# Patient Record
Sex: Male | Born: 1945
Health system: Southern US, Community
[De-identification: ages and names within clinical notes are randomized; demographics above are authoritative.]

## PROBLEM LIST (undated history)

## (undated) DIAGNOSIS — I10 Essential (primary) hypertension: Secondary | ICD-10-CM

## (undated) DIAGNOSIS — Z8711 Personal history of peptic ulcer disease: Secondary | ICD-10-CM

## (undated) DIAGNOSIS — K219 Gastro-esophageal reflux disease without esophagitis: Secondary | ICD-10-CM

## (undated) DIAGNOSIS — M1990 Unspecified osteoarthritis, unspecified site: Secondary | ICD-10-CM

## (undated) DIAGNOSIS — Z8719 Personal history of other diseases of the digestive system: Secondary | ICD-10-CM

## (undated) HISTORY — PX: OTHER SURGICAL HISTORY: SHX169

---

## 2001-09-01 ENCOUNTER — Emergency Department (HOSPITAL_COMMUNITY): Admission: EM | Admit: 2001-09-01 | Discharge: 2001-09-01 | Payer: Self-pay | Admitting: Emergency Medicine

## 2001-09-08 ENCOUNTER — Ambulatory Visit (HOSPITAL_COMMUNITY): Admission: RE | Admit: 2001-09-08 | Discharge: 2001-09-08 | Payer: Self-pay | Admitting: Emergency Medicine

## 2001-09-08 ENCOUNTER — Encounter: Payer: Self-pay | Admitting: Emergency Medicine

## 2001-09-22 ENCOUNTER — Ambulatory Visit (HOSPITAL_BASED_OUTPATIENT_CLINIC_OR_DEPARTMENT_OTHER): Admission: RE | Admit: 2001-09-22 | Discharge: 2001-09-23 | Payer: Self-pay | Admitting: Orthopedic Surgery

## 2008-10-24 ENCOUNTER — Ambulatory Visit: Payer: Self-pay | Admitting: Orthopedic Surgery

## 2008-10-24 DIAGNOSIS — M25519 Pain in unspecified shoulder: Secondary | ICD-10-CM

## 2011-04-27 ENCOUNTER — Emergency Department (HOSPITAL_COMMUNITY): Payer: Medicare PPO

## 2011-04-27 ENCOUNTER — Inpatient Hospital Stay (HOSPITAL_COMMUNITY)
Admission: EM | Admit: 2011-04-27 | Discharge: 2011-05-04 | DRG: 177 | Disposition: A | Discharge status: Home / Self Care | Payer: Medicare PPO | Attending: Internal Medicine | Admitting: Internal Medicine

## 2011-04-27 ENCOUNTER — Encounter (HOSPITAL_COMMUNITY): Payer: Self-pay | Admitting: Radiology

## 2011-04-27 DIAGNOSIS — E876 Hypokalemia: Secondary | ICD-10-CM | POA: Diagnosis not present

## 2011-04-27 DIAGNOSIS — D649 Anemia, unspecified: Secondary | ICD-10-CM | POA: Diagnosis not present

## 2011-04-27 DIAGNOSIS — N179 Acute kidney failure, unspecified: Secondary | ICD-10-CM | POA: Diagnosis present

## 2011-04-27 DIAGNOSIS — A419 Sepsis, unspecified organism: Secondary | ICD-10-CM | POA: Diagnosis present

## 2011-04-27 DIAGNOSIS — E872 Acidosis, unspecified: Secondary | ICD-10-CM | POA: Diagnosis present

## 2011-04-27 DIAGNOSIS — A481 Legionnaires' disease: Principal | ICD-10-CM | POA: Diagnosis present

## 2011-04-27 DIAGNOSIS — R7301 Impaired fasting glucose: Secondary | ICD-10-CM | POA: Diagnosis present

## 2011-04-27 DIAGNOSIS — B370 Candidal stomatitis: Secondary | ICD-10-CM | POA: Diagnosis present

## 2011-04-27 DIAGNOSIS — J9600 Acute respiratory failure, unspecified whether with hypoxia or hypercapnia: Secondary | ICD-10-CM | POA: Diagnosis present

## 2011-04-27 HISTORY — DX: Essential (primary) hypertension: I10

## 2011-04-27 LAB — DIFFERENTIAL
Basophils Absolute: 0 10*3/uL (ref 0.0–0.1)
Basophils Relative: 0 % (ref 0–1)
Eosinophils Absolute: 0 10*3/uL (ref 0.0–0.7)
Eosinophils Relative: 0 % (ref 0–5)
Lymphocytes Relative: 3 % — ABNORMAL LOW (ref 12–46)
Lymphs Abs: 0.5 10*3/uL — ABNORMAL LOW (ref 0.7–4.0)
Monocytes Absolute: 1.3 10*3/uL — ABNORMAL HIGH (ref 0.1–1.0)
Monocytes Relative: 7 % (ref 3–12)
Neutro Abs: 18.6 10*3/uL — ABNORMAL HIGH (ref 1.7–7.7)
Neutrophils Relative %: 91 % — ABNORMAL HIGH (ref 43–77)

## 2011-04-27 LAB — LACTIC ACID, PLASMA: Lactic Acid, Venous: 2.5 mmol/L — ABNORMAL HIGH (ref 0.5–2.2)

## 2011-04-27 LAB — INFLUENZA PANEL BY PCR (TYPE A & B): Influenza A By PCR: NEGATIVE

## 2011-04-27 LAB — CBC
HCT: 40.2 % (ref 39.0–52.0)
Hemoglobin: 13.5 g/dL (ref 13.0–17.0)
MCH: 32.1 pg (ref 26.0–34.0)
MCHC: 33.6 g/dL (ref 30.0–36.0)
MCV: 95.7 fL (ref 78.0–100.0)
Platelets: 167 10*3/uL (ref 150–400)
RBC: 4.2 MIL/uL — ABNORMAL LOW (ref 4.22–5.81)
RDW: 13.8 % (ref 11.5–15.5)
WBC: 20.4 10*3/uL — ABNORMAL HIGH (ref 4.0–10.5)

## 2011-04-27 LAB — BASIC METABOLIC PANEL
Calcium: 8.6 mg/dL (ref 8.4–10.5)
GFR calc Af Amer: 41 mL/min — ABNORMAL LOW (ref >60)
GFR calc non Af Amer: 34 mL/min — ABNORMAL LOW (ref >60)
Glucose, Bld: 200 mg/dL — ABNORMAL HIGH (ref 70–99)
Potassium: 3.7 mEq/L (ref 3.5–5.1)
Sodium: 134 mEq/L — ABNORMAL LOW (ref 135–145)

## 2011-04-27 NOTE — H&P (Signed)
NAMEARDEN, John Clarke                ACCOUNT NO.:  [id number]  MEDICAL RECORD NO.:  [id number]           PATIENT TYPE:  E  LOCATION:  [facility]ED                          FACILITY:  [facility]  PHYSICIAN:  Wilson Singer, M.D.DATE OF BIRTH:  [birth year]  DATE OF ADMISSION:  04/27/2011 DATE OF DISCHARGE:  LH                             HISTORY & PHYSICAL   PRIMARY CARE PHYSICIAN:  Madelin Rear. Fusco, MD  CHIEF COMPLAINT:  Left chest pleuritic pain, diarrhea, nausea.  HISTORY OF PRESENT ILLNESS:  This is a 65 year old man who is generally quite healthy gives a 3-day history of left-sided pleuritic chest pain associated with nausea and diarrhea.  He also has been feeling somewhat febrile.  He presents to the emergency room here because he was not getting better and workup so far shows a chest x-ray consistent with left upper lobe pneumonia.  PAST MEDICAL HISTORY:  Significant for hypertension.  PAST SURGICAL HISTORY:  Right arm surgery for some sort of tendon rupture from lifting heavy weights in the gym.  SOCIAL HISTORY:  The patient is divorced but has a girlfriend at the present time.  He does not smoke, does not drink alcohol.  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  FAMILY HISTORY:  Noncontributory.  REVIEW OF SYSTEMS:  Apart from the symptoms mentioned above, there are no other symptoms referable to all systems reviewed.  MEDICATIONS:  Nifedipine 60 mg daily.  ALLERGIES:  No known drug allergies.  PHYSICAL EXAMINATION:  VITAL SIGNS:  Temperature 101.2, blood pressure 151/74, pulse 80, respiratory rate of 16, saturation 93% on room air. GENERAL:  He looks systemically well and he is not toxic.  He does not have increased work of breathing.  He does get pain in his left chest which is pleuritic in nature whenever I asked him to breathe in.  There is no clubbing.  There is no jaundice. CARDIOVASCULAR:  Heart sounds are present and normal and appear to be in sinus  rhythm.  Jugular venous pressure is not raised. RESPIRATORY:  Air entry is reduced in the left lung.  There is a reduced air entry in the left upper and middle zones.  There is no bronchial breathing or crackles. ABDOMEN:  Soft and nontender with no evidence of hepatosplenomegaly or any masses. NEUROLOGIC:  He is alert and oriented without any focal neurologic signs. SKIN:  There are no skin lesions or rashes.  INVESTIGATIONS:  Chest x-ray shows left upper and lingular lobe pneumonia.  Hemoglobin 13.5, white blood cell count 20.4 with 91% neutrophilia, platelets 167.  Sodium 134, potassium 3.7, bicarbonate 27, glucose elevated at 200, BUN 26, creatinine 2.01.  PROBLEM LIST: 1. Acute left upper lobe pneumonia. 2. Acute renal failure. 3. Hypertension. 4. Hyperglycemia.  PLAN: 1. Admit. 2. Intravenous antibiotics. 3. Intravenous fluids. 4. Check hemoglobin A1c to see if he is diabetic.  This may be just a     stress response or he may actually be diabetic.  Further recommendations will depend on the patient's hospital progress.    Wilson Singer, M.D.     NCG/MEDQ  D:  04/27/2011  T:  04/27/2011  Job:  [id number]  cc:   Madelin Rear. Sherwood Gambler, MD Fax: [phone]  Electronically Signed by Lilly Cove M.D. on 04/27/2011 05:05:35 PM

## 2011-04-28 ENCOUNTER — Inpatient Hospital Stay (HOSPITAL_COMMUNITY): Payer: Medicare PPO

## 2011-04-28 LAB — DIFFERENTIAL
Basophils Relative: 0 % (ref 0–1)
Eosinophils Absolute: 0 10*3/uL (ref 0.0–0.7)
Neutro Abs: 12.2 10*3/uL — ABNORMAL HIGH (ref 1.7–7.7)
Neutrophils Relative %: 88 % — ABNORMAL HIGH (ref 43–77)

## 2011-04-28 LAB — STREP PNEUMONIAE URINARY ANTIGEN: Strep Pneumo Urinary Antigen: NEGATIVE

## 2011-04-28 LAB — CBC
Platelets: 170 10*3/uL (ref 150–400)
RBC: 3.94 MIL/uL — ABNORMAL LOW (ref 4.22–5.81)
WBC: 13.8 10*3/uL — ABNORMAL HIGH (ref 4.0–10.5)

## 2011-04-28 LAB — COMPREHENSIVE METABOLIC PANEL
ALT: 57 U/L — ABNORMAL HIGH (ref 0–53)
Alkaline Phosphatase: 56 U/L (ref 39–117)
BUN: 29 mg/dL — ABNORMAL HIGH (ref 6–23)
CO2: 28 mEq/L (ref 19–32)
Calcium: 8.8 mg/dL (ref 8.4–10.5)
GFR calc non Af Amer: 33 mL/min — ABNORMAL LOW (ref >60)
Glucose, Bld: 158 mg/dL — ABNORMAL HIGH (ref 70–99)
Sodium: 136 mEq/L (ref 135–145)

## 2011-04-28 LAB — HEMOGLOBIN A1C
Hgb A1c MFr Bld: 6.4 % — ABNORMAL HIGH (ref <5.7)
Mean Plasma Glucose: 137 mg/dL — ABNORMAL HIGH (ref <117)

## 2011-04-29 LAB — CBC
MCH: 31.7 pg (ref 26.0–34.0)
Platelets: 177 10*3/uL (ref 150–400)
RBC: 3.72 MIL/uL — ABNORMAL LOW (ref 4.22–5.81)
WBC: 8.3 10*3/uL (ref 4.0–10.5)

## 2011-04-29 LAB — DIFFERENTIAL
Basophils Absolute: 0 10*3/uL (ref 0.0–0.1)
Basophils Relative: 0 % (ref 0–1)
Eosinophils Absolute: 0 10*3/uL (ref 0.0–0.7)
Neutrophils Relative %: 79 % — ABNORMAL HIGH (ref 43–77)

## 2011-04-29 LAB — BLOOD GAS, ARTERIAL
Bicarbonate: 23.3 mEq/L (ref 20.0–24.0)
Drawn by: 23534
Patient temperature: 37
pH, Arterial: 7.442 (ref 7.350–7.450)

## 2011-04-29 LAB — COMPREHENSIVE METABOLIC PANEL
AST: 86 U/L — ABNORMAL HIGH (ref 0–37)
Albumin: 2.4 g/dL — ABNORMAL LOW (ref 3.5–5.2)
Chloride: 104 mEq/L (ref 96–112)
Creatinine, Ser: 1.72 mg/dL — ABNORMAL HIGH (ref 0.4–1.5)
GFR calc Af Amer: 49 mL/min — ABNORMAL LOW (ref >60)
Potassium: 3.4 mEq/L — ABNORMAL LOW (ref 3.5–5.1)
Total Bilirubin: 0.4 mg/dL (ref 0.3–1.2)

## 2011-04-30 LAB — DIFFERENTIAL
Basophils Absolute: 0 10*3/uL (ref 0.0–0.1)
Basophils Relative: 0 % (ref 0–1)
Neutro Abs: 5.8 10*3/uL (ref 1.7–7.7)
Neutrophils Relative %: 73 % (ref 43–77)

## 2011-04-30 LAB — COMPREHENSIVE METABOLIC PANEL
ALT: 130 U/L — ABNORMAL HIGH (ref 0–53)
AST: 130 U/L — ABNORMAL HIGH (ref 0–37)
Albumin: 2.3 g/dL — ABNORMAL LOW (ref 3.5–5.2)
CO2: 25 mEq/L (ref 19–32)
Calcium: 8.9 mg/dL (ref 8.4–10.5)
Chloride: 106 mEq/L (ref 96–112)
GFR calc Af Amer: >60 mL/min (ref >60)
GFR calc non Af Amer: 51 mL/min — ABNORMAL LOW (ref >60)
Sodium: 138 mEq/L (ref 135–145)
Total Bilirubin: 0.4 mg/dL (ref 0.3–1.2)

## 2011-04-30 LAB — CBC
Hemoglobin: 11.5 g/dL — ABNORMAL LOW (ref 13.0–17.0)
RBC: 3.65 MIL/uL — ABNORMAL LOW (ref 4.22–5.81)

## 2011-05-01 LAB — COMPREHENSIVE METABOLIC PANEL
Albumin: 2.5 g/dL — ABNORMAL LOW (ref 3.5–5.2)
Alkaline Phosphatase: 90 U/L (ref 39–117)
BUN: 20 mg/dL (ref 6–23)
Calcium: 9.6 mg/dL (ref 8.4–10.5)
Creatinine, Ser: 1.32 mg/dL (ref 0.4–1.5)
Glucose, Bld: 136 mg/dL — ABNORMAL HIGH (ref 70–99)
Potassium: 3.6 mEq/L (ref 3.5–5.1)
Total Protein: 7.2 g/dL (ref 6.0–8.3)

## 2011-05-01 LAB — DIFFERENTIAL
Eosinophils Absolute: 0.2 10*3/uL (ref 0.0–0.7)
Eosinophils Relative: 2 % (ref 0–5)
Lymphs Abs: 1.3 10*3/uL (ref 0.7–4.0)
Monocytes Absolute: 1.3 10*3/uL — ABNORMAL HIGH (ref 0.1–1.0)

## 2011-05-01 LAB — ROCKY MTN SPOTTED FVR AB, IGG-BLOOD: RMSF IgG: 0.36 IV

## 2011-05-01 LAB — CBC
MCHC: 32.4 g/dL (ref 30.0–36.0)
MCV: 94.4 fL (ref 78.0–100.0)
Platelets: 288 10*3/uL (ref 150–400)
RDW: 14.3 % (ref 11.5–15.5)
WBC: 9.2 10*3/uL (ref 4.0–10.5)

## 2011-05-02 LAB — CULTURE, RESPIRATORY W GRAM STAIN

## 2011-05-02 LAB — CBC
HCT: 34.6 % — ABNORMAL LOW (ref 39.0–52.0)
MCHC: 33.2 g/dL (ref 30.0–36.0)
MCV: 93.8 fL (ref 78.0–100.0)
Platelets: 375 10*3/uL (ref 150–400)
RDW: 14.3 % (ref 11.5–15.5)
WBC: 9.1 10*3/uL (ref 4.0–10.5)

## 2011-05-02 LAB — CULTURE, BLOOD (ROUTINE X 2)
Culture: NO GROWTH
Culture: NO GROWTH
Culture: NO GROWTH

## 2011-05-02 LAB — DIFFERENTIAL
Basophils Absolute: 0.1 10*3/uL (ref 0.0–0.1)
Eosinophils Absolute: 0.2 10*3/uL (ref 0.0–0.7)
Eosinophils Relative: 3 % (ref 0–5)
Lymphocytes Relative: 12 % (ref 12–46)
Lymphs Abs: 1 10*3/uL (ref 0.7–4.0)
Monocytes Absolute: 1.5 10*3/uL — ABNORMAL HIGH (ref 0.1–1.0)

## 2011-05-02 LAB — BASIC METABOLIC PANEL
BUN: 20 mg/dL (ref 6–23)
Calcium: 9.5 mg/dL (ref 8.4–10.5)
GFR calc non Af Amer: 57 mL/min — ABNORMAL LOW (ref >60)
Glucose, Bld: 107 mg/dL — ABNORMAL HIGH (ref 70–99)
Potassium: 3.5 mEq/L (ref 3.5–5.1)

## 2011-05-03 LAB — COMPREHENSIVE METABOLIC PANEL
AST: 173 U/L — ABNORMAL HIGH (ref 0–37)
Albumin: 2.5 g/dL — ABNORMAL LOW (ref 3.5–5.2)
Alkaline Phosphatase: 109 U/L (ref 39–117)
BUN: 19 mg/dL (ref 6–23)
Chloride: 105 mEq/L (ref 96–112)
GFR calc Af Amer: >60 mL/min (ref >60)
Potassium: 3.7 mEq/L (ref 3.5–5.1)
Sodium: 140 mEq/L (ref 135–145)
Total Protein: 7 g/dL (ref 6.0–8.3)

## 2011-05-03 LAB — CBC
Platelets: 461 10*3/uL — ABNORMAL HIGH (ref 150–400)
RBC: 3.64 MIL/uL — ABNORMAL LOW (ref 4.22–5.81)
RDW: 14.5 % (ref 11.5–15.5)
WBC: 10.7 10*3/uL — ABNORMAL HIGH (ref 4.0–10.5)

## 2011-05-03 LAB — DIFFERENTIAL
Basophils Absolute: 0.1 10*3/uL (ref 0.0–0.1)
Basophils Relative: 1 % (ref 0–1)
Eosinophils Absolute: 0.3 10*3/uL (ref 0.0–0.7)
Eosinophils Relative: 3 % (ref 0–5)
Neutrophils Relative %: 71 % (ref 43–77)

## 2011-05-04 LAB — DIFFERENTIAL
Basophils Relative: 1 % (ref 0–1)
Eosinophils Relative: 3 % (ref 0–5)
Monocytes Absolute: 1.3 10*3/uL — ABNORMAL HIGH (ref 0.1–1.0)
Monocytes Relative: 11 % (ref 3–12)
Neutro Abs: 9 10*3/uL — ABNORMAL HIGH (ref 1.7–7.7)

## 2011-05-04 LAB — COMPREHENSIVE METABOLIC PANEL
BUN: 20 mg/dL (ref 6–23)
CO2: 28 mEq/L (ref 19–32)
Chloride: 104 mEq/L (ref 96–112)
Creatinine, Ser: 1.24 mg/dL (ref 0.4–1.5)
GFR calc non Af Amer: 59 mL/min — ABNORMAL LOW (ref >60)
Total Bilirubin: 0.5 mg/dL (ref 0.3–1.2)

## 2011-05-04 LAB — CBC
Hemoglobin: 11.6 g/dL — ABNORMAL LOW (ref 13.0–17.0)
MCH: 30.7 pg (ref 26.0–34.0)
MCHC: 32.3 g/dL (ref 30.0–36.0)
MCV: 95 fL (ref 78.0–100.0)
Platelets: 557 10*3/uL — ABNORMAL HIGH (ref 150–400)
RBC: 3.78 MIL/uL — ABNORMAL LOW (ref 4.22–5.81)

## 2011-05-05 NOTE — Group Therapy Note (Signed)
  John Clarke, John Clarke                ACCOUNT NO.:  [id number]  MEDICAL RECORD NO.:  [id number]           PATIENT TYPE:  I  LOCATION:  A338                          FACILITY:  [facility]  PHYSICIAN:  Wilson Singer, M.D.DATE OF BIRTH:  1946/01/10  DATE OF PROCEDURE:  04/28/2011 DATE OF DISCHARGE:                                PROGRESS NOTE   This man was admitted yesterday with a left upper lobe pneumonia. Overnight, he has had fevers consistently.  This morning, he feels little bit better without fever.  He is somewhat short of breath.  PHYSICAL EXAMINATION:  VITAL SIGNS:  Temperature 102.9 at 6 o'clock this morning, blood pressure 115/65, pulse 84, saturating 89% on 4 liters on oxygen. GENERAL:  He does looks systemically well and hemodynamically stable. He is not toxic. Cardiovascular:  Heart sounds are present and normal without murmurs. RESPIRATORY:  Lung fields now show clear bronchial breathing in the left upper, mid and mid zones.  There are no other crackles or wheezes throughout either lung. ABDOMEN:  Soft, nontender.  INVESTIGATIONS:  Hemoglobin 12.5, white blood cell count of 13.8 which is significantly decreased from yesterday of 20.4, platelets 170. Sodium 136, potassium 3.5, bicarbonate 28, glucose 158, BUN 29, and creatinine 2.04 which is not significantly changed.  Influenza A and B by PCR is negative.  IMPRESSION: 1. Left upper lobe pneumonia, community acquired. 2. Acute renal failure, not improved. 3. Hypertension, currently normotensive. 4. Hyperglycemia, await hemoglobin A1c.  PLAN: 1. Continue with intravenous Zithromax and Rocephin. 2. Repeat a chest x-ray today. 3. I will increase his intravenous fluids. 4. Await hemoglobin A1c to evaluate for his hyperglycemia.    Wilson Singer, M.D.    NCG/MEDQ  D:  04/28/2011  T:  04/28/2011  Job:  [id number]  Electronically Signed by Lilly Cove M.D. on 05/05/2011 08:17:20 AM

## 2011-05-05 NOTE — Discharge Summary (Signed)
NAMERANDI, COLLEGE                ACCOUNT NO.:  [id number]  MEDICAL RECORD NO.:  [id number]           PATIENT TYPE:  I  LOCATION:  A338                          FACILITY:  [facility]  PHYSICIAN:  Hillery Aldo, M.D.   DATE OF BIRTH:  [birth year]  DATE OF ADMISSION:  04/27/2011 DATE OF DISCHARGE:  05/14/2012LH                              DISCHARGE SUMMARY   PRIMARY CARE PHYSICIAN:  Madelin Rear. Sherwood Gambler, MD  DISCHARGE DIAGNOSES: 1. Legionnaires pneumonia. 2. Sepsis secondary to Legionnaires pneumonia. 3. Hypoxic respiratory failure. 4. Hypokalemia. 5. Acute renal failure, resolved. 6. Impaired fasting glucose. 7. Transaminitis secondary to Legionnaires disease. 8. Mild normocytic anemia. 9. Hypertension. 10.Recent history of tick exposure with headache. 11.Candidiasis.  DISCHARGE MEDICATIONS: 1. Tessalon Perles 100 mg p.o. t.i.d. p.r.n. cough. 2. Mucinex LA 1200 mg p.o. b.i.d. p.r.n. thick mucus. 3. Levaquin 500 mg p.o. daily times one more week. 4. Multivitamin 1 tablet p.o. daily. 5. Nifedipine XR 60 mg p.o. daily. 6. Zantac 150 mg p.o. daily p.r.n. heartburn.  CONSULTATIONS:  None.  BRIEF ADMISSION HISTORY OF PRESENT ILLNESS:  The patient is a 65 year old male with a past medical history of hypertension who presented to the hospital with a chief complaint of left-sided pleuritic chest pain, fever, and lethargy.  Upon initial evaluation in the emergency department, the patient was found to have infiltrates on chest radiography consistent with a left upper lobe pneumonia and he subsequently was referred to the Hospitalist Service for further evaluation and treatment.  For the full details, please see the dictated report done by Dr. Karilyn Cota.  PROCEDURES AND DIAGNOSTIC STUDIES: 1. Chest x-ray on Apr 27, 2011, showed extensive infiltrative changes     within the left upper lobe with a small left pleural effusion. 2. Chest x-ray on Apr 28, 2011, showed increasing diffuse left  lung     infiltrate consistent with pneumonia and bibasilar effusions.  DISCHARGE LABORATORY VALUES:  Sodium was 140, potassium 3.7, chloride 105, bicarb 29, BUN 19, creatinine 1.23, glucose 109, calcium 9.7, total bilirubin 0.5, alkaline phosphatase 109, AST 173, ALT 265, total protein 7, albumin 2.5.  White blood cell count was 10.7, hemoglobin 11.3, hematocrit 34.6, platelets 461.  Legionella urinary antigen was positive.  Blood cultures have not shown any growth.  [facility] spotted fever, IgG was 0.36 (negative).  Hemoglobin A1c was 6.4. Influenza was negative.  Lactic acid was 2.5.  HOSPITAL COURSE BY PROBLEM: 1. Legionnaires disease:  The patient was admitted and put on empiric     Rocephin and azithromycin, was presumed to be a community-acquired     pneumonia.  Sputum cultures, blood cultures, and strep pneumonia     antigen, as well as urinary Legionella antigen studies were     obtained and ultimately showed that the patient had Legionella     pneumonia.  The patient was subsequently put on Levaquin therapy     and the Rocephin and azithromycin were discontinued.  Over the     course of the patient's hospital stay, he was initially septic and     hypoxic and these issues have resolved.  He has defervesced and is     beginning to feel better.  He will be discharged on additional 7     days of treatment with Levaquin for a total treatment duration of     14 days. 2. Sepsis secondary to Legionnaires disease:  The patient did meet the     definition of sepsis and was quite ill on initial presentation.  He     was in acute renal failure with a lactic acidosis.  He was hypoxic.     He was febrile with an initial white blood cell count of 20.4.     With appropriate treatment, the patient's sepsis syndrome has     resolved. 3. Hypoxic respiratory failure:  The patient had hypoxic respiratory     failure and increased oxygen requirement, needing a 100% non-     rebreather mask  for several days.  As his clinical condition     improved, his hypoxia improved and we were ultimately able to wean     oxygen to off.  He is currently maintaining his oxygen saturations     off of oxygen. 4. Hypokalemia:  The patient's potassium was monitored closely and     repleted. 5. Acute renal failure:  The patient's initial creatinine was 2.01.     He is put on IV fluid hydration and his underlying sepsis was     treated.  His discharge creatinine is 1.23. 6. Impaired fasting glucose:  The patient does have impaired fasting     glucose and hemoglobin A1c was checked which was mildly elevated at     6.4.  This corresponds to a mean plasma glucose of 137.  The     patient was counseled extensively with regard to appropriate     dietary changes to avoid progression to overt diabetes. 7. Transaminitis:  The patient's transaminitis is secondary to     Legionnaires disease which is a prominent feature of this disorder.     We have monitored his liver function studies closely and they are     beginning to trend downward.  He should follow up with his primary     care physician later in a week to have these retested. 8. Mild normocytic anemia:  The patient's hemoglobin and hematocrit     have remained stable throughout the course of his hospital stay.     No further diagnostic evaluation was undertaken and we will leave     this to the discretion of his primary care physician.  If he has     not had appropriate outpatient screening colonoscopy, I would     recommend this. 9. Hypertension:  The patient's blood pressure is well controlled on     his home regimen of nifedipine. 10.Tick exposure with headache:  The patient did report a recent tick     exposure and headache while in the hospital.  The patient had no     rashes and his headache could adequately be explained by     Legionnaire's disease.  Nevertheless, [facility] spotted fever     titers were sent off and IgM has come  back as normal.  He was put     on empiric doxycycline for several days while in the hospital. 11.Candidiasis:  The patient's sputum cultures grew yeast which is     likely due to over colonization from recent heavy antibiotic     exposure.  The patient has completed 3 days of therapy  with     Diflucan.  DISPOSITION:  The patient is medically stable and will be discharged home.  DISCHARGE INSTRUCTIONS:  Increase activity slowly.  Follow up with your primary care physician on May 08, 2011, at scheduled appointment time.  DISCHARGE DIET:  Low carbohydrate/low concentrated sweets.  CONDITION ON DISCHARGE:  Improved.  Time spent coordinating care for discharge and discharge instructions equals approximately 40 minutes.     Hillery Aldo, M.D.     CR/MEDQ  D:  05/04/2011  T:  05/04/2011  Job:  [id number]  cc:   Madelin Rear. Sherwood Gambler, MD Fax: [phone]  Electronically Signed by Hillery Aldo M.D. on 05/05/2011 04:40:39 PM

## 2011-05-08 LAB — LEGIONELLA ANTIGEN, URINE: Legionella Antigen, Urine: POSITIVE

## 2011-05-08 NOTE — Op Note (Signed)
. [facility]  Patient:    John Clarke, John Clarke Visit Number: [id number] MRN: [id number]          Service Type: DSU Location: [facility] Attending Physician:  Colbert Ewing Dictated by:   Loreta Ave, M.D. Proc. Date: 09/22/01 Admit Date:  09/22/2001                             Operative Report  PREOPERATIVE DIAGNOSIS:  Avulsion biceps tendon off radial tuberosity, right elbow.  POSTOPERATIVE DIAGNOSIS:  Avulsion biceps tendon off radial tuberosity, right elbow.  OPERATION PERFORMED:  Repair of biceps tendon to radial tuberosity utilizing fiberwire implanting in a drill hole back in the radius.  Two incision technique.  SURGEON:  Loreta Ave, M.D.  ASSISTANT:  Arlys John D. Petrarca, P.A.-C.  ANESTHESIA:  General.  ESTIMATED BLOOD LOSS:  Minimal.  TOURNIQUET TIME:  One hour and 15 minutes.  SPECIMENS:  None.  CULTURES:  None.  COMPLICATIONS:  None.  DRESSING:  Soft compressive with long arm splint.  DESCRIPTION OF PROCEDURE:  The patient was brought to the operating room and placed on the operating table in supine position.  After adequate anesthesia had been obtained, right elbow examined.  Soft 10 to 15 degree flexion contracture manipulated to fairly full extension.  Full flexion, pronation, supination and stable elbow.  Tourniquet applied.  Prepped and draped in the usual sterile fashion.  Small transverse incision anterior aspect of the elbow after the tourniquet had been inflated and the arm exsanguinated with Esmarch. Tourniquet inflated to 250 mmHg.  Through the small transverse incision, the biceps tendon which was retracted and scarred in, in the distal arm was captured, freed up, mobilized and tapered to its original size.  It was then captured with a #2 fiberwire.  A longitudinal incision was made between the r radius and ulna on the posterior aspect of the proximal forearm to expose the radial tuberosity.  Muscle  group taken down off the ulna exposing the interval between the radius and the ulna.  Radial tuberosity identified.  A suture passer was then used to recreate the tract of the biceps tendon from the posterior incision up into the antecubital fossa.  Sutures were grasped at the antecubital fossa and brought out through the distal incision.  A large drill hole was then made in the radius at the radial tuberosity original attachment site and two smaller drill holes exiting out the opposite site of the radius. Sutures were then brought with suture passer through the larger hole and out through the smaller holes.  These were then firmly pulled down with the forearm supinated.  I was able to bring the biceps tendon down into the larger hole and then these sutures were overtied over a bony bridge on the opposite side of the radius.  Yielded a nice firm repair bringing the biceps down to original attachment site.  Still moderately tight trying to bring him into extension.  Because of the chronicity of his tear.  Reasonable motion achieved after repair.  Both wounds were copiously irrigated.  The anterior wound closed with subcutaneous and subcuticular Vicryl.  Posterior wound was closed with the fascia with Vicryl after thorough irrigation and then subcutaneous subcuticular closure with Vicryl suture.  Steri-Strips applied in both areas and injected with Marcaine.  Sterile compressive dressing and long arm splint applied.  Tourniquet inflated and removed.  Anesthesia reversed.  Brought to recovery room.  Tolerated surgery well.  No complications. Dictated by:   Loreta Ave, M.D. Attending Physician:  Colbert Ewing DD:  09/22/01 TD:  09/22/01 Job: [id number]

## 2012-10-17 ENCOUNTER — Encounter (HOSPITAL_COMMUNITY): Payer: Self-pay | Admitting: Pharmacy Technician

## 2012-10-19 NOTE — Patient Instructions (Addendum)
Your procedure is scheduled on: 10/27/2012  Report to [facility] at  0830       AM.  Call this number if you have problems the morning of surgery: [phone]   Do not eat food or drink liquids :After Midnight.      Take these medicines the morning of surgery with A SIP OF WATER:procardia   Do not wear jewelry, make-up or nail polish.  Do not wear lotions, powders, or perfumes.   Do not shave 48 hours prior to surgery.  Do not bring valuables to the hospital.  Contacts, dentures or bridgework may not be worn into surgery.  Leave suitcase in the car. After surgery it may be brought to your room.  For patients admitted to the hospital, checkout time is 11:00 AM the day of discharge.   Patients discharged the day of surgery will not be allowed to drive home.  :     Please read over the following fact sheets that you were given: Coughing and Deep Breathing, Surgical Site Infection Prevention, Anesthesia Post-op Instructions and Care and Recovery After Surgery    Cataract A cataract is a clouding of the lens of the eye. When a lens becomes cloudy, vision is reduced based on the degree and nature of the clouding. Many cataracts reduce vision to some degree. Some cataracts make people more near-sighted as they develop. Other cataracts increase glare. Cataracts that are ignored and become worse can sometimes look white. The white color can be seen through the pupil. CAUSES   Aging. However, cataracts may occur at any age, even in newborns.   Certain drugs.   Trauma to the eye.   Certain diseases such as diabetes.   Specific eye diseases such as chronic inflammation inside the eye or a sudden attack of a rare form of glaucoma.   Inherited or acquired medical problems.  SYMPTOMS   Gradual, progressive drop in vision in the affected eye.   Severe, rapid visual loss. This most often happens when trauma is the cause.  DIAGNOSIS  To detect a cataract, an eye doctor examines the lens.  Cataracts are best diagnosed with an exam of the eyes with the pupils enlarged (dilated) by drops.  TREATMENT  For an early cataract, vision may improve by using different eyeglasses or stronger lighting. If that does not help your vision, surgery is the only effective treatment. A cataract needs to be surgically removed when vision loss interferes with your everyday activities, such as driving, reading, or watching TV. A cataract may also have to be removed if it prevents examination or treatment of another eye problem. Surgery removes the cloudy lens and usually replaces it with a substitute lens (intraocular lens, IOL).  At a time when both you and your doctor agree, the cataract will be surgically removed. If you have cataracts in both eyes, only one is usually removed at a time. This allows the operated eye to heal and be out of danger from any possible problems after surgery (such as infection or poor wound healing). In rare cases, a cataract may be doing damage to your eye. In these cases, your caregiver may advise surgical removal right away. The vast majority of people who have cataract surgery have better vision afterward. HOME CARE INSTRUCTIONS  If you are not planning surgery, you may be asked to do the following:  Use different eyeglasses.   Use stronger or brighter lighting.   Ask your eye doctor about reducing your medicine dose  or changing medicines if it is thought that a medicine caused your cataract. Changing medicines does not make the cataract go away on its own.   Become familiar with your surroundings. Poor vision can lead to injury. Avoid bumping into things on the affected side. You are at a higher risk for tripping or falling.   Exercise extreme care when driving or operating machinery.   Wear sunglasses if you are sensitive to bright light or experiencing problems with glare.  SEEK IMMEDIATE MEDICAL CARE IF:   You have a worsening or sudden vision loss.   You notice  redness, swelling, or increasing pain in the eye.   You have a fever.  Document Released: 12/07/2005 Document Revised: 11/26/2011 Document Reviewed: 07/31/2011 [facility] Patient Information 2012 [place], [place].PATIENT INSTRUCTIONS POST-ANESTHESIA  IMMEDIATELY FOLLOWING SURGERY:  Do not drive or operate machinery for the first twenty four hours after surgery.  Do not make any important decisions for twenty four hours after surgery or while taking narcotic pain medications or sedatives.  If you develop intractable nausea and vomiting or a severe headache please notify your doctor immediately.  FOLLOW-UP:  Please make an appointment with your surgeon as instructed. You do not need to follow up with anesthesia unless specifically instructed to do so.  WOUND CARE INSTRUCTIONS (if applicable):  Keep a dry clean dressing on the anesthesia/puncture wound site if there is drainage.  Once the wound has quit draining you may leave it open to air.  Generally you should leave the bandage intact for twenty four hours unless there is drainage.  If the epidural site drains for more than 36-48 hours please call the anesthesia department.  QUESTIONS?:  Please feel free to call your physician or the hospital operator if you have any questions, and they will be happy to assist you.

## 2012-10-20 ENCOUNTER — Encounter (HOSPITAL_COMMUNITY): Admission: RE | Admit: 2012-10-20 | Discharge: 2012-10-20 | Payer: Medicare PPO | Source: Ambulatory Visit

## 2012-10-21 ENCOUNTER — Encounter (HOSPITAL_COMMUNITY): Payer: Self-pay

## 2012-10-21 ENCOUNTER — Encounter (HOSPITAL_COMMUNITY)
Admission: RE | Admit: 2012-10-21 | Discharge: 2012-10-21 | Disposition: A | Discharge status: Home / Self Care | Payer: Medicare PPO | Source: Ambulatory Visit | Attending: Ophthalmology | Admitting: Ophthalmology

## 2012-10-21 HISTORY — DX: Personal history of peptic ulcer disease: Z87.11

## 2012-10-21 HISTORY — DX: Personal history of other diseases of the digestive system: Z87.19

## 2012-10-21 HISTORY — DX: Unspecified osteoarthritis, unspecified site: M19.90

## 2012-10-21 HISTORY — DX: Gastro-esophageal reflux disease without esophagitis: K21.9

## 2012-10-21 LAB — BASIC METABOLIC PANEL
GFR calc Af Amer: 60 mL/min — ABNORMAL LOW (ref >90)
GFR calc non Af Amer: 52 mL/min — ABNORMAL LOW (ref >90)
Potassium: 4 mEq/L (ref 3.5–5.1)
Sodium: 139 mEq/L (ref 135–145)

## 2012-10-21 LAB — HEMOGLOBIN AND HEMATOCRIT, BLOOD
HCT: 39.6 % (ref 39.0–52.0)
Hemoglobin: 13.5 g/dL (ref 13.0–17.0)

## 2012-10-21 NOTE — Patient Instructions (Addendum)
20 KIYOTO SLOMSKI  10/21/2012   Your procedure is scheduled on:  10/27/12  Report to [facility] at 08:40 AM.  Call this number if you have problems the morning of surgery: [phone]   Remember:   Do not eat or drink:After Midnight.  Take these medicines the morning of surgery with A SIP OF WATER: Nifedipine   Do not wear jewelry, make-up or nail polish.  Do not wear lotions, powders, or perfumes. You may wear deodorant.  Do not shave 48 hours prior to surgery. Men may shave face and neck.  Do not bring valuables to the hospital.  Contacts, dentures or bridgework may not be worn into surgery.  Leave suitcase in the car. After surgery it may be brought to your room.  For patients admitted to the hospital, checkout time is 11:00 AM the day of discharge.   Patients discharged the day of surgery will not be allowed to drive home.   Special Instructions: Start using your eye drops as directed by your eye doctor.   Please read over the following fact sheets that you were given: Anesthesia Post-op Instructions    Cataract Surgery  A cataract is a clouding of the lens of the eye. When a lens becomes cloudy, vision is reduced based on the degree and nature of the clouding. Surgery may be needed to improve vision. Surgery removes the cloudy lens and usually replaces it with a substitute lens (intraocular lens, IOL). LET YOUR EYE DOCTOR KNOW ABOUT:  Allergies to food or medicine.  Medicines taken including herbs, eyedrops, over-the-counter medicines, and creams.  Use of steroids (by mouth or creams).  Previous problems with anesthetics or numbing medicine.  History of bleeding problems or blood clots.  Previous surgery.  Other health problems, including diabetes and kidney problems.  Possibility of pregnancy, if this applies. RISKS AND COMPLICATIONS  Infection.  Inflammation of the eyeball (endophthalmitis) that can spread to both eyes (sympathetic ophthalmia).  Poor wound  healing.  If an IOL is inserted, it can later fall out of proper position. This is very uncommon.  Clouding of the part of your eye that holds an IOL in place. This is called an "after-cataract." These are uncommon, but easily treated. BEFORE THE PROCEDURE  Do not eat or drink anything except small amounts of water for 8 to 12 before your surgery, or as directed by your caregiver.  Unless you are told otherwise, continue any eyedrops you have been prescribed.  Talk to your primary caregiver about all other medicines that you take (both prescription and non-prescription). In some cases, you may need to stop or change medicines near the time of your surgery. This is most important if you are taking blood-thinning medicine.Do not stop medicines unless you are told to do so.  Arrange for someone to drive you to and from the procedure.  Do not put contact lenses in either eye on the day of your surgery. PROCEDURE There is more than one method for safely removing a cataract. Your doctor can explain the differences and help determine which is best for you. Phacoemulsification surgery is the most common form of cataract surgery.  An injection is given behind the eye or eyedrops are given to make this a painless procedure.  A small cut (incision) is made on the edge of the clear, dome-shaped surface that covers the front of the eye (cornea).  A tiny probe is painlessly inserted into the eye. This device gives off ultrasound waves that soften  and break up the cloudy center of the lens. This makes it easier for the cloudy lens to be removed by suction.  An IOL may be implanted.  The normal lens of the eye is covered by a clear capsule. Part of that capsule is intentionally left in the eye to support the IOL.  Your surgeon may or may not use stitches to close the incision. There are other forms of cataract surgery that require a larger incision and stiches to close the eye. This approach is  taken in cases where the doctor feels that the cataract cannot be easily removed using phacoemulsification. AFTER THE PROCEDURE  When an IOL is implanted, it does not need care. It becomes a permanent part of your eye and cannot be seen or felt.  Your doctor will schedule follow-up exams to check on your progress.  Review your other medicines with your doctor to see which can be resumed after surgery.  Use eyedrops or take medicine as prescribed by your doctor. Document Released: 11/26/2011 Document Revised: 02/29/2012 Document Reviewed: 11/26/2011 [facility] [address] Patient Information 2013 [place], [place].    PATIENT INSTRUCTIONS POST-ANESTHESIA  IMMEDIATELY FOLLOWING SURGERY:  Do not drive or operate machinery for the first twenty four hours after surgery.  Do not make any important decisions for twenty four hours after surgery or while taking narcotic pain medications or sedatives.  If you develop intractable nausea and vomiting or a severe headache please notify your doctor immediately.  FOLLOW-UP:  Please make an appointment with your surgeon as instructed. You do not need to follow up with anesthesia unless specifically instructed to do so.  WOUND CARE INSTRUCTIONS (if applicable):  Keep a dry clean dressing on the anesthesia/puncture wound site if there is drainage.  Once the wound has quit draining you may leave it open to air.  Generally you should leave the bandage intact for twenty four hours unless there is drainage.  If the epidural site drains for more than 36-48 hours please call the anesthesia department.  QUESTIONS?:  Please feel free to call your physician or the hospital operator if you have any questions, and they will be happy to assist you.

## 2012-10-26 MED ORDER — TETRACAINE HCL 0.5 % OP SOLN
OPHTHALMIC | Status: AC
  Filled 2012-10-26: qty 2

## 2012-10-26 MED ORDER — CYCLOPENTOLATE-PHENYLEPHRINE 0.2-1 % OP SOLN
OPHTHALMIC | Status: AC
  Filled 2012-10-26: qty 2

## 2012-10-26 MED ORDER — PHENYLEPHRINE HCL 2.5 % OP SOLN
OPHTHALMIC | Status: AC
  Filled 2012-10-26: qty 2

## 2012-10-26 MED ORDER — LIDOCAINE HCL (PF) 1 % IJ SOLN
INTRAMUSCULAR | Status: AC
  Filled 2012-10-26: qty 2

## 2012-10-26 MED ORDER — LIDOCAINE HCL 3.5 % OP GEL
OPHTHALMIC | Status: AC
  Filled 2012-10-26: qty 5

## 2012-10-26 MED ORDER — NEOMYCIN-POLYMYXIN-DEXAMETH 3.5-10000-0.1 OP OINT
TOPICAL_OINTMENT | OPHTHALMIC | Status: AC
  Filled 2012-10-26: qty 3.5

## 2012-10-27 ENCOUNTER — Encounter (HOSPITAL_COMMUNITY): Payer: Self-pay | Admitting: Ophthalmology

## 2012-10-27 ENCOUNTER — Ambulatory Visit (HOSPITAL_COMMUNITY): Payer: Medicare PPO | Admitting: Anesthesiology

## 2012-10-27 ENCOUNTER — Ambulatory Visit (HOSPITAL_COMMUNITY)
Admission: RE | Admit: 2012-10-27 | Discharge: 2012-10-27 | Disposition: A | Discharge status: Home / Self Care | Payer: Medicare PPO | Source: Ambulatory Visit | Attending: Ophthalmology | Admitting: Ophthalmology

## 2012-10-27 ENCOUNTER — Encounter (HOSPITAL_COMMUNITY)
Admission: RE | Disposition: A | Discharge status: Home / Self Care | Payer: Self-pay | Source: Ambulatory Visit | Attending: Ophthalmology

## 2012-10-27 ENCOUNTER — Encounter (HOSPITAL_COMMUNITY): Payer: Self-pay | Admitting: Anesthesiology

## 2012-10-27 DIAGNOSIS — Z01812 Encounter for preprocedural laboratory examination: Secondary | ICD-10-CM | POA: Insufficient documentation

## 2012-10-27 DIAGNOSIS — H2510 Age-related nuclear cataract, unspecified eye: Secondary | ICD-10-CM | POA: Insufficient documentation

## 2012-10-27 DIAGNOSIS — I10 Essential (primary) hypertension: Secondary | ICD-10-CM | POA: Insufficient documentation

## 2012-10-27 HISTORY — PX: CATARACT EXTRACTION W/PHACO: SHX586

## 2012-10-27 SURGERY — PHACOEMULSIFICATION, CATARACT, WITH IOL INSERTION
Anesthesia: Monitor Anesthesia Care | Site: Eye | Laterality: Right | Wound class: Clean

## 2012-10-27 MED ORDER — MIDAZOLAM HCL 2 MG/2ML IJ SOLN
1.0000 mg | INTRAMUSCULAR | Status: DC | PRN
  Administered 2012-10-27: 2 mg via INTRAVENOUS

## 2012-10-27 MED ORDER — LIDOCAINE HCL (PF) 1 % IJ SOLN
INTRAOCULAR | Status: DC | PRN
  Administered 2012-10-27: 10:00:00 via OPHTHALMIC

## 2012-10-27 MED ORDER — PROVISC 10 MG/ML IO SOLN
INTRAOCULAR | Status: DC | PRN
  Administered 2012-10-27: 8.5 mg via INTRAOCULAR

## 2012-10-27 MED ORDER — MIDAZOLAM HCL 2 MG/2ML IJ SOLN
INTRAMUSCULAR | Status: AC
  Filled 2012-10-27: qty 2

## 2012-10-27 MED ORDER — POVIDONE-IODINE 5 % OP SOLN
OPHTHALMIC | Status: DC | PRN
  Administered 2012-10-27: 1 via OPHTHALMIC

## 2012-10-27 MED ORDER — CYCLOPENTOLATE HCL 1 % OP SOLN: 1.0000 [drp] | OPHTHALMIC | Status: DC

## 2012-10-27 MED ORDER — NEOMYCIN-POLYMYXIN-DEXAMETH 0.1 % OP OINT
TOPICAL_OINTMENT | OPHTHALMIC | Status: DC | PRN
  Administered 2012-10-27: 1 via OPHTHALMIC

## 2012-10-27 MED ORDER — CYCLOPENTOLATE-PHENYLEPHRINE 0.2-1 % OP SOLN
1.0000 [drp] | OPHTHALMIC | Status: AC
  Administered 2012-10-27 (×3): 1 [drp] via OPHTHALMIC

## 2012-10-27 MED ORDER — TETRACAINE HCL 0.5 % OP SOLN
1.0000 [drp] | OPHTHALMIC | Status: AC
  Administered 2012-10-27 (×3): 1 [drp] via OPHTHALMIC

## 2012-10-27 MED ORDER — ONDANSETRON HCL 4 MG/2ML IJ SOLN: 4.0000 mg | Freq: Once | INTRAMUSCULAR | Status: DC | PRN

## 2012-10-27 MED ORDER — EPINEPHRINE HCL 1 MG/ML IJ SOLN
INTRAMUSCULAR | Status: AC
  Filled 2012-10-27: qty 1

## 2012-10-27 MED ORDER — LIDOCAINE 3.5 % OP GEL OPTIME - NO CHARGE
OPHTHALMIC | Status: DC | PRN
  Administered 2012-10-27: 1 [drp] via OPHTHALMIC

## 2012-10-27 MED ORDER — FENTANYL CITRATE 0.05 MG/ML IJ SOLN: 25.0000 ug | INTRAMUSCULAR | Status: DC | PRN

## 2012-10-27 MED ORDER — LACTATED RINGERS IV SOLN
INTRAVENOUS | Status: DC
  Administered 2012-10-27: 10:00:00 via INTRAVENOUS

## 2012-10-27 MED ORDER — EPINEPHRINE HCL 1 MG/ML IJ SOLN
INTRAOCULAR | Status: DC | PRN
  Administered 2012-10-27: 10:00:00

## 2012-10-27 MED ORDER — LIDOCAINE HCL 3.5 % OP GEL
1.0000 "application " | Freq: Once | OPHTHALMIC | Status: AC
  Administered 2012-10-27: 1 via OPHTHALMIC

## 2012-10-27 MED ORDER — BSS IO SOLN
INTRAOCULAR | Status: DC | PRN
  Administered 2012-10-27: 15 mL via INTRAOCULAR

## 2012-10-27 MED ORDER — PHENYLEPHRINE HCL 2.5 % OP SOLN
1.0000 [drp] | OPHTHALMIC | Status: AC
  Administered 2012-10-27 (×3): 1 [drp] via OPHTHALMIC

## 2012-10-27 SURGICAL SUPPLY — 32 items
CAPSULAR TENSION RING-AMO (OPHTHALMIC RELATED) IMPLANT
CLOTH BEACON ORANGE TIMEOUT ST (SAFETY) ×2 IMPLANT
EYE SHIELD UNIVERSAL CLEAR (GAUZE/BANDAGES/DRESSINGS) ×2 IMPLANT
GLOVE BIO SURGEON STRL SZ 6.5 (GLOVE) IMPLANT
GLOVE BIOGEL PI IND STRL 6.5 (GLOVE) ×2 IMPLANT
GLOVE BIOGEL PI IND STRL 7.0 (GLOVE) IMPLANT
GLOVE BIOGEL PI IND STRL 7.5 (GLOVE) IMPLANT
GLOVE BIOGEL PI INDICATOR 6.5 (GLOVE) ×2
GLOVE BIOGEL PI INDICATOR 7.0 (GLOVE)
GLOVE BIOGEL PI INDICATOR 7.5 (GLOVE)
GLOVE ECLIPSE 6.5 STRL STRAW (GLOVE) IMPLANT
GLOVE ECLIPSE 7.0 STRL STRAW (GLOVE) IMPLANT
GLOVE ECLIPSE 7.5 STRL STRAW (GLOVE) IMPLANT
GLOVE EXAM NITRILE LRG STRL (GLOVE) IMPLANT
GLOVE EXAM NITRILE MD LF STRL (GLOVE) ×2 IMPLANT
GLOVE SKINSENSE NS SZ6.5 (GLOVE)
GLOVE SKINSENSE NS SZ7.0 (GLOVE)
GLOVE SKINSENSE STRL SZ6.5 (GLOVE) IMPLANT
GLOVE SKINSENSE STRL SZ7.0 (GLOVE) IMPLANT
KIT VITRECTOMY (OPHTHALMIC RELATED) IMPLANT
PAD ARMBOARD 7.5X6 YLW CONV (MISCELLANEOUS) ×2 IMPLANT
PROC W NO LENS (INTRAOCULAR LENS)
PROC W SPEC LENS (INTRAOCULAR LENS)
PROCESS W NO LENS (INTRAOCULAR LENS) IMPLANT
PROCESS W SPEC LENS (INTRAOCULAR LENS) IMPLANT
RING MALYGIN (MISCELLANEOUS) IMPLANT
SIGHTPATH CAT PROC W REG LENS (Ophthalmic Related) ×2 IMPLANT
SYR TB 1ML LL NO SAFETY (SYRINGE) ×2 IMPLANT
TAPE SURG TRANSPORE 1 IN (GAUZE/BANDAGES/DRESSINGS) ×1 IMPLANT
TAPE SURGICAL TRANSPORE 1 IN (GAUZE/BANDAGES/DRESSINGS) ×1
VISCOELASTIC ADDITIONAL (OPHTHALMIC RELATED) IMPLANT
WATER STERILE IRR 250ML POUR (IV SOLUTION) ×2 IMPLANT

## 2012-10-27 NOTE — Anesthesia Preprocedure Evaluation (Signed)
Anesthesia Evaluation  Patient identified by MRN, date of birth, ID band Patient awake    Reviewed: Allergy & Precautions, H&P , NPO status , Patient's Chart, lab work & pertinent test results  Airway Mallampati: I TM Distance: >3 FB     Dental  (+) Teeth Intact   Pulmonary neg pulmonary ROS,  breath sounds clear to auscultation        Cardiovascular hypertension, Pt. on medications Rhythm:Regular     Neuro/Psych    GI/Hepatic   Endo/Other    Renal/GU      Musculoskeletal   Abdominal   Peds  Hematology   Anesthesia Other Findings   Reproductive/Obstetrics                           Anesthesia Physical Anesthesia Plan  ASA: II  Anesthesia Plan: MAC   Post-op Pain Management:    Induction: Intravenous  Airway Management Planned: Nasal Cannula  Additional Equipment:   Intra-op Plan:   Post-operative Plan:   Informed Consent: I have reviewed the patients History and Physical, chart, labs and discussed the procedure including the risks, benefits and alternatives for the proposed anesthesia with the patient or authorized representative who has indicated his/her understanding and acceptance.     Plan Discussed with:   Anesthesia Plan Comments:         Anesthesia Quick Evaluation

## 2012-10-27 NOTE — Brief Op Note (Signed)
Pre-Op Dx: Cataract OD Post-Op Dx: Cataract OD Surgeon: Eber Ferrufino Anesthesia: Topical with MAC Surgery: Cataract Extraction with Intraocular lens Implant OD Implant: B&L enVista Specimen: None Complications: None 

## 2012-10-27 NOTE — H&P (Signed)
I have reviewed the H&P, the patient was re-examined, and I have identified no interval changes in medical condition and plan of care since the history and physical of record  

## 2012-10-27 NOTE — Transfer of Care (Signed)
Immediate Anesthesia Transfer of Care Note  Patient: John Clarke  Procedure(s) Performed: Procedure(s) (LRB) with comments: CATARACT EXTRACTION PHACO AND INTRAOCULAR LENS PLACEMENT (IOC) (Right) - CDE:10.68  Patient Location: PACU and Short Stay  Anesthesia Type:MAC  Level of Consciousness: awake, alert , oriented and patient cooperative  Airway & Oxygen Therapy: Patient Spontanous Breathing  Post-op Assessment: Report given to PACU RN and Post -op Vital signs reviewed and stable  Post vital signs: Reviewed and stable  Complications: No apparent anesthesia complications

## 2012-10-27 NOTE — Anesthesia Postprocedure Evaluation (Signed)
  Anesthesia Post-op Note  Patient: John Clarke  Procedure(s) Performed: Procedure(s) (LRB) with comments: CATARACT EXTRACTION PHACO AND INTRAOCULAR LENS PLACEMENT (IOC) (Right) - CDE:10.68  Patient Location: PACU and Short Stay  Anesthesia Type:MAC  Level of Consciousness: awake, alert , oriented and patient cooperative  Airway and Oxygen Therapy: Patient Spontanous Breathing  Post-op Pain: none  Post-op Assessment: Post-op Vital signs reviewed, Patient's Cardiovascular Status Stable, Respiratory Function Stable, Patent Airway and Pain level controlled  Post-op Vital Signs: Reviewed and stable  Complications: No apparent anesthesia complications

## 2012-10-28 NOTE — Op Note (Signed)
NAMETOBEY, SCHMELZLE NO.:  [id number]  MEDICAL RECORD NO.:  [id number]  LOCATION:  [facility]                          FACILITY:  [facility]  PHYSICIAN:  Susanne Greenhouse, MD       DATE OF BIRTH:  November 08, 1946  DATE OF PROCEDURE:  10/27/2012 DATE OF DISCHARGE:  10/27/2012                              OPERATIVE REPORT   PREOPERATIVE DIAGNOSIS:  Nuclear cataract, right eye, diagnosis code 366.16.  POSTOPERATIVE DIAGNOSES:  Nuclear cataract, right eye, diagnosis code 366.16.  SURGEON:  Susanne Greenhouse, MD  OPERATION PERFORMED:  Phacoemulsification with posterior chamber intraocular lens implantation, right eye.  ANESTHESIA:  Topical with monitored anesthesia care and IV sedation.  OPERATIVE SUMMARY:  In the preoperative area, dilating drops were placed into the right eye.  The patient was then brought into the operating room where he was placed under topical anesthesia and IV sedation.  The eye was then prepped and draped.  Beginning with a 75 blade, a paracentesis port was made at the surgeon's 2 o'clock position.  The anterior chamber was then filled with a 1% nonpreserved lidocaine solution with epinephrine.  This was followed by Viscoat to deepen the chamber.  A small fornix-based peritomy was performed superiorly.  Next, a single iris hook was placed through the limbus superiorly.  A 2.4-mm keratome blade was then used to make a clear corneal incision over the iris hook.  A bent cystotome needle and Utrata forceps were used to create a continuous tear capsulotomy.  Hydrodissection was performed using balanced salt solution on a fine cannula.  The lens nucleus was then removed using phacoemulsification in a quadrant cracking technique. The cortical material was then removed with irrigation and aspiration. The capsular bag and anterior chamber were refilled with Provisc.  The wound was widened to approximately 3 mm and a posterior chamber intraocular lens was placed into the  capsular bag without difficulty using an [employer] lens injecting system.  A single 10-0 nylon suture was then used to close the incision as well as stromal hydration. The Provisc was removed from the anterior chamber and capsular bag with irrigation and aspiration.  At this point, the wounds were tested for leak, which were negative.  The anterior chamber remained deep and stable.  The patient tolerated the procedure well.  There were no operative complications, and he awoke from topical anesthesia and IV sedation without problem.  No surgical specimens.  Prosthetic device used is a Theme park manager, model EnVista, model number MX60, power of 18.5, serial number is [id number].          ______________________________ Susanne Greenhouse, MD     KEH/MEDQ  D:  10/27/2012  T:  10/28/2012  Job:  [id number]

## 2012-10-31 ENCOUNTER — Encounter (HOSPITAL_COMMUNITY): Payer: Self-pay | Admitting: Ophthalmology

## 2014-08-14 ENCOUNTER — Other Ambulatory Visit (HOSPITAL_COMMUNITY): Payer: Self-pay | Admitting: Urology

## 2014-08-14 DIAGNOSIS — C61 Malignant neoplasm of prostate: Secondary | ICD-10-CM

## 2014-08-30 ENCOUNTER — Encounter (HOSPITAL_COMMUNITY)
Admission: RE | Admit: 2014-08-30 | Discharge: 2014-08-30 | Disposition: A | Discharge status: Home / Self Care | Payer: Medicare PPO | Source: Ambulatory Visit | Attending: Urology | Admitting: Urology

## 2014-08-30 ENCOUNTER — Ambulatory Visit (HOSPITAL_COMMUNITY)
Admission: RE | Admit: 2014-08-30 | Discharge: 2014-08-30 | Disposition: A | Discharge status: Home / Self Care | Payer: Medicare PPO | Source: Ambulatory Visit | Attending: Urology | Admitting: Urology

## 2014-08-30 DIAGNOSIS — C61 Malignant neoplasm of prostate: Secondary | ICD-10-CM

## 2014-08-30 DIAGNOSIS — R930 Abnormal findings on diagnostic imaging of skull and head, not elsewhere classified: Secondary | ICD-10-CM | POA: Insufficient documentation

## 2014-08-30 MED ORDER — TECHNETIUM TC 99M MEDRONATE IV KIT
25.1000 | PACK | Freq: Once | INTRAVENOUS | Status: AC | PRN
  Administered 2014-08-30: 25.1 via INTRAVENOUS

## 2014-09-06 ENCOUNTER — Ambulatory Visit (HOSPITAL_COMMUNITY)
Admission: RE | Admit: 2014-09-06 | Discharge: 2014-09-06 | Disposition: A | Discharge status: Home / Self Care | Payer: Medicare PPO | Source: Ambulatory Visit | Attending: Urology | Admitting: Urology

## 2014-09-06 ENCOUNTER — Other Ambulatory Visit (HOSPITAL_COMMUNITY): Payer: Self-pay | Admitting: Urology

## 2014-09-06 DIAGNOSIS — C61 Malignant neoplasm of prostate: Secondary | ICD-10-CM

## 2014-09-07 ENCOUNTER — Other Ambulatory Visit (HOSPITAL_COMMUNITY): Payer: Self-pay | Admitting: Urology

## 2014-09-07 DIAGNOSIS — C61 Malignant neoplasm of prostate: Secondary | ICD-10-CM

## 2014-09-13 ENCOUNTER — Ambulatory Visit (HOSPITAL_COMMUNITY)
Admission: RE | Admit: 2014-09-13 | Discharge: 2014-09-13 | Disposition: A | Discharge status: Home / Self Care | Payer: Medicare PPO | Source: Ambulatory Visit | Attending: Urology | Admitting: Urology

## 2014-09-13 DIAGNOSIS — C61 Malignant neoplasm of prostate: Secondary | ICD-10-CM | POA: Insufficient documentation

## 2014-09-13 DIAGNOSIS — H9319 Tinnitus, unspecified ear: Secondary | ICD-10-CM | POA: Diagnosis not present

## 2014-09-17 ENCOUNTER — Ambulatory Visit: Payer: Medicare PPO | Admitting: Radiation Oncology

## 2014-09-17 ENCOUNTER — Ambulatory Visit: Payer: Medicare PPO

## 2014-10-23 ENCOUNTER — Institutional Professional Consult (permissible substitution): Payer: Self-pay | Admitting: Radiation Oncology

## 2015-02-28 DIAGNOSIS — N320 Bladder-neck obstruction: Secondary | ICD-10-CM | POA: Diagnosis not present

## 2015-02-28 DIAGNOSIS — C61 Malignant neoplasm of prostate: Secondary | ICD-10-CM | POA: Diagnosis not present

## 2015-02-28 DIAGNOSIS — N138 Other obstructive and reflux uropathy: Secondary | ICD-10-CM | POA: Diagnosis not present

## 2015-02-28 DIAGNOSIS — Z79899 Other long term (current) drug therapy: Secondary | ICD-10-CM | POA: Diagnosis not present

## 2015-02-28 DIAGNOSIS — I10 Essential (primary) hypertension: Secondary | ICD-10-CM | POA: Diagnosis not present

## 2015-03-06 DIAGNOSIS — C61 Malignant neoplasm of prostate: Secondary | ICD-10-CM | POA: Diagnosis not present

## 2015-03-18 DIAGNOSIS — I10 Essential (primary) hypertension: Secondary | ICD-10-CM | POA: Diagnosis not present

## 2015-03-18 DIAGNOSIS — C61 Malignant neoplasm of prostate: Secondary | ICD-10-CM | POA: Diagnosis not present

## 2015-03-18 DIAGNOSIS — N138 Other obstructive and reflux uropathy: Secondary | ICD-10-CM | POA: Diagnosis not present

## 2015-03-25 DIAGNOSIS — C61 Malignant neoplasm of prostate: Secondary | ICD-10-CM | POA: Diagnosis not present

## 2015-04-02 DIAGNOSIS — C61 Malignant neoplasm of prostate: Secondary | ICD-10-CM | POA: Diagnosis not present

## 2015-04-02 DIAGNOSIS — R339 Retention of urine, unspecified: Secondary | ICD-10-CM | POA: Diagnosis not present

## 2015-05-03 IMAGING — CT CT HEAD W/O CM
1 series · 16 of 30 positions shown, 20 images · non-contrast
Comparison: None.

CLINICAL DATA: Tinnitus ; prostate carcinoma

EXAM:
CT HEAD WITHOUT CONTRAST
TECHNIQUE: Contiguous axial images were obtained from the base of the skull
through the vertex without intravenous contrast.

[Series 2: headseq 4.8 h45s · axial · 0.45mm/px · z∈[-63,+89]mm · 16 of 36 slices shown, 20 images]
[im 2/36  brain]
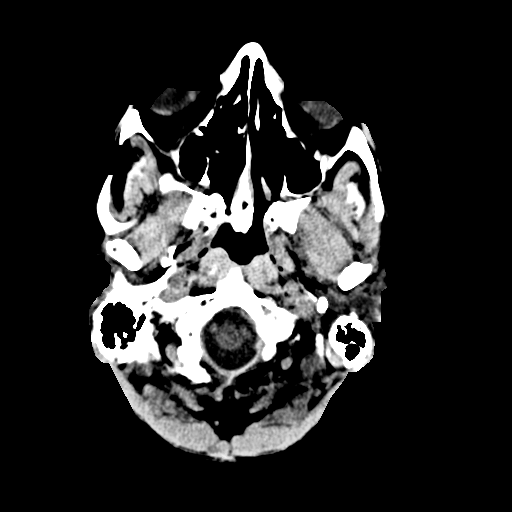
[im 2/36  bone]
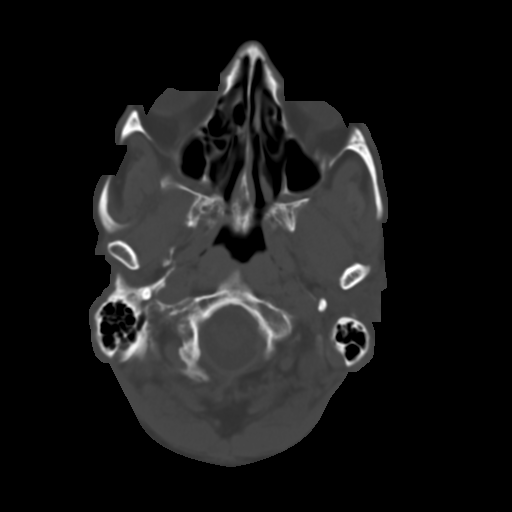
[im 4/36  brain]
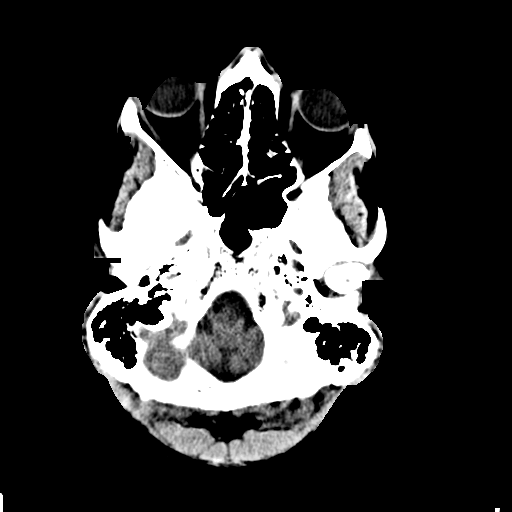
[im 7/36  brain]
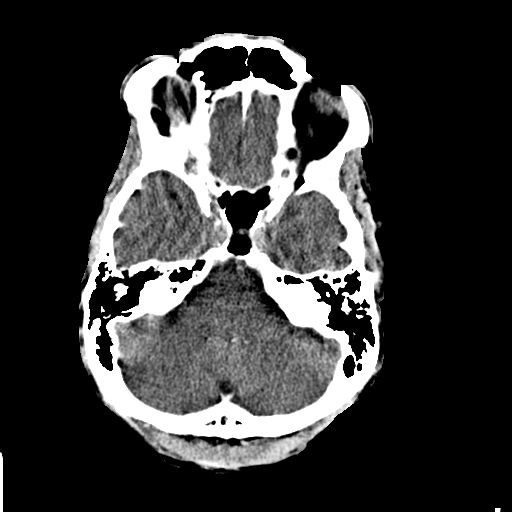
[im 9/36  brain]
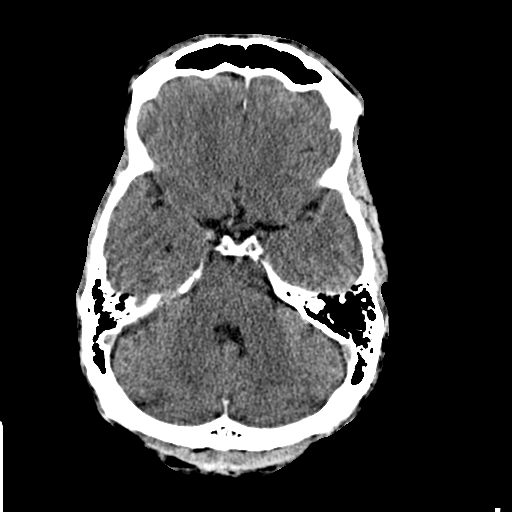
[im 10/36  brain]
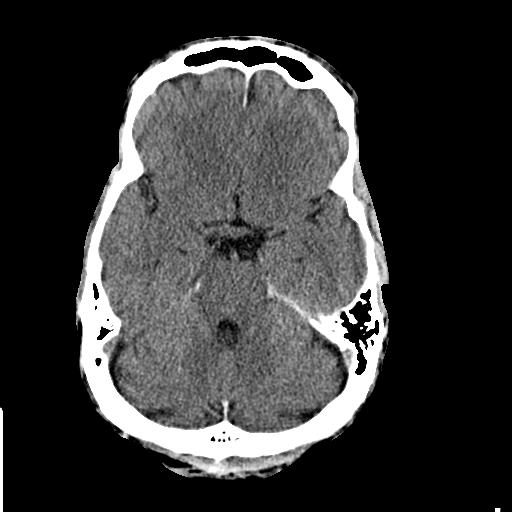
[im 10/36  bone]
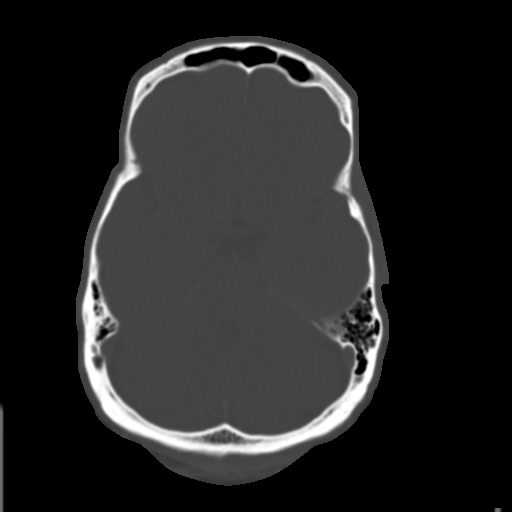
[im 13/36  brain]
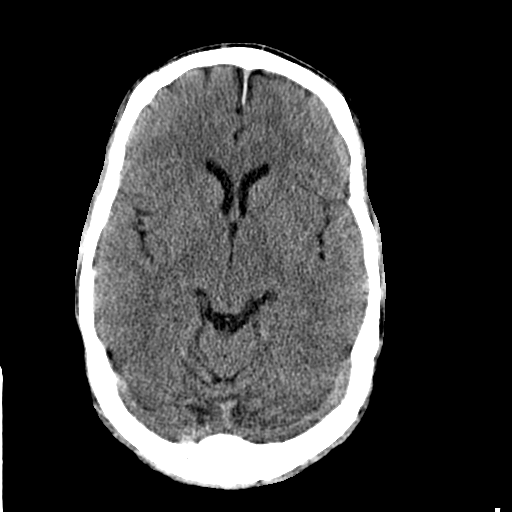
[im 15/36  brain]
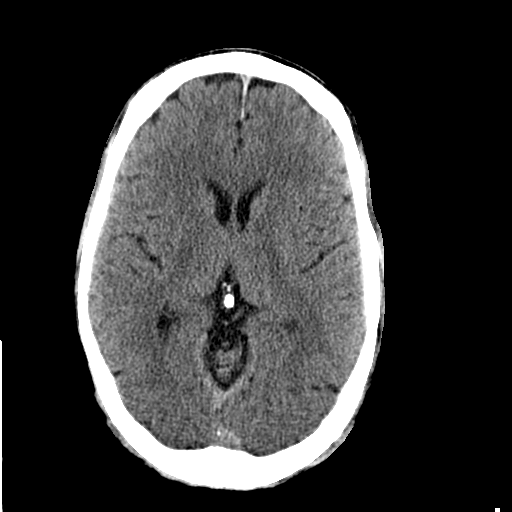
[im 17/36  brain]
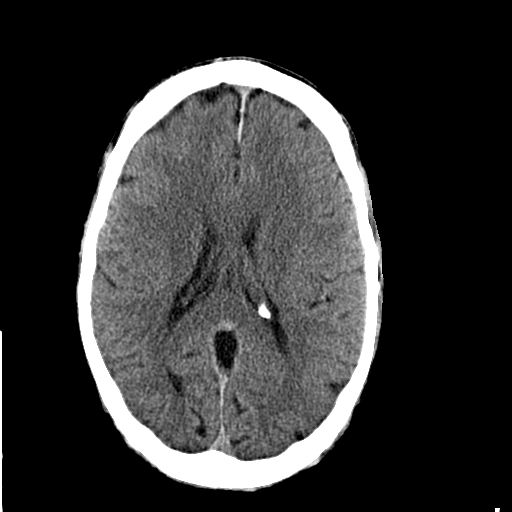
[im 19/36  brain]
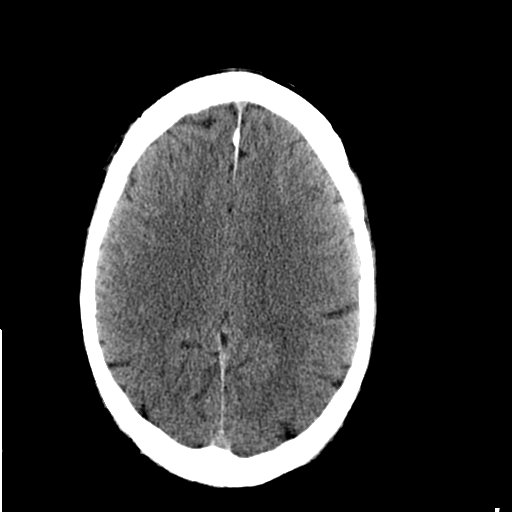
[im 19/36  bone]
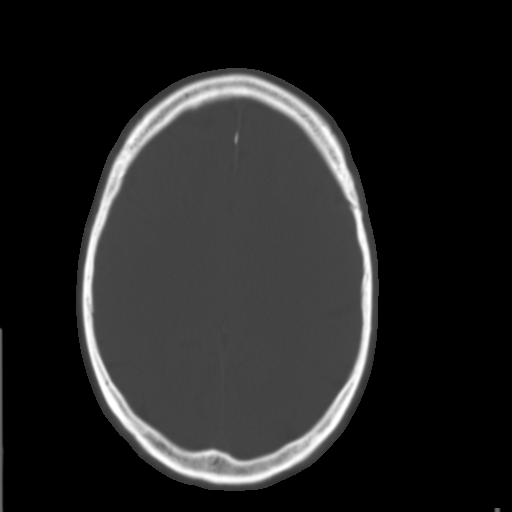
[im 21/36  brain]
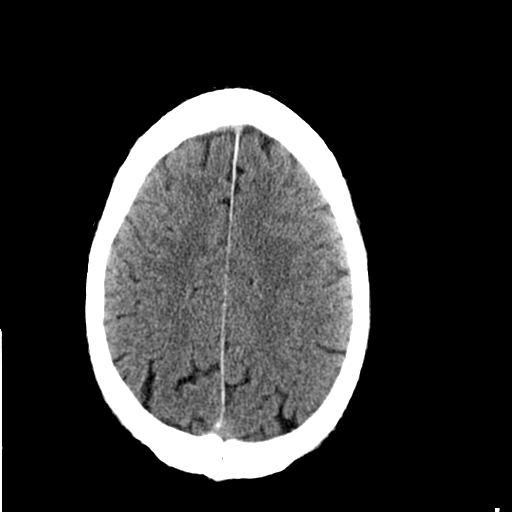
[im 23/36  brain]
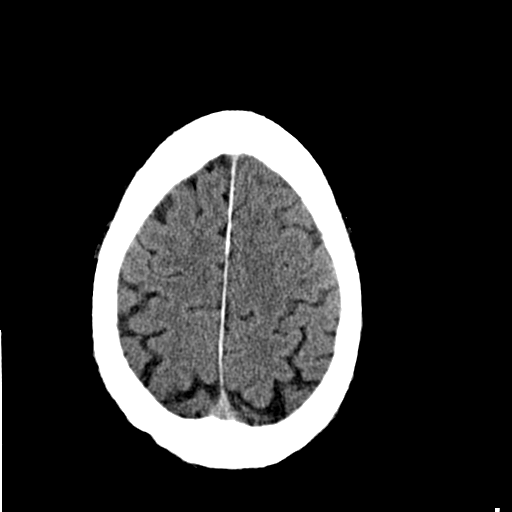
[im 26/36  brain]
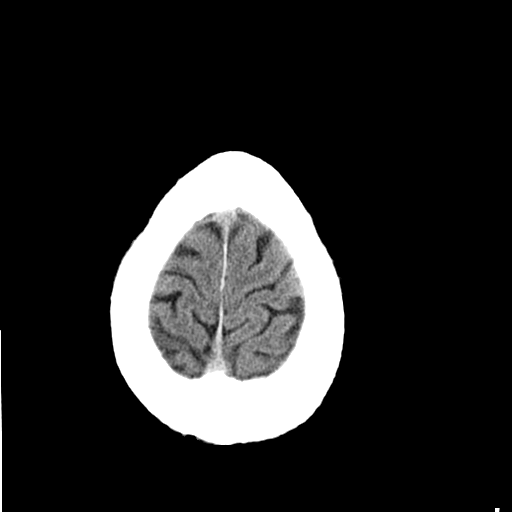
[im 27/36  brain]
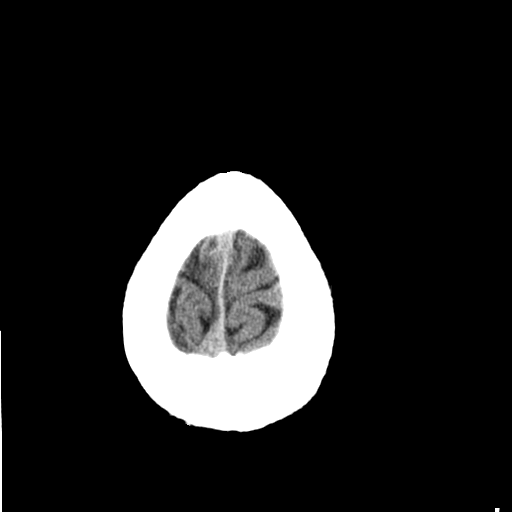
[im 27/36  bone]
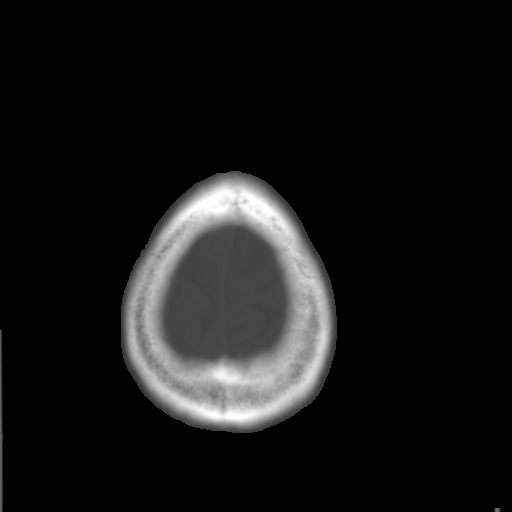
[im 29/36  brain]
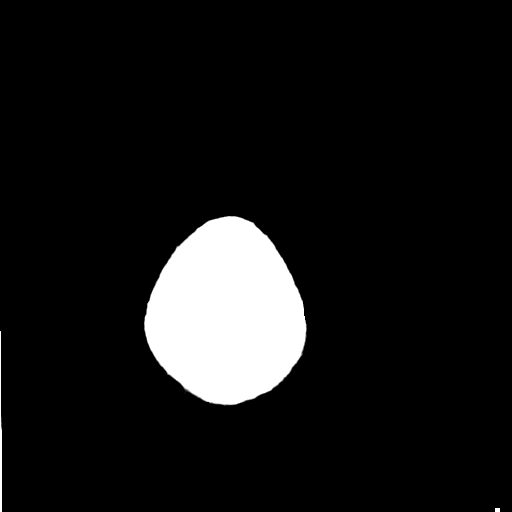
[im 32/36  brain]
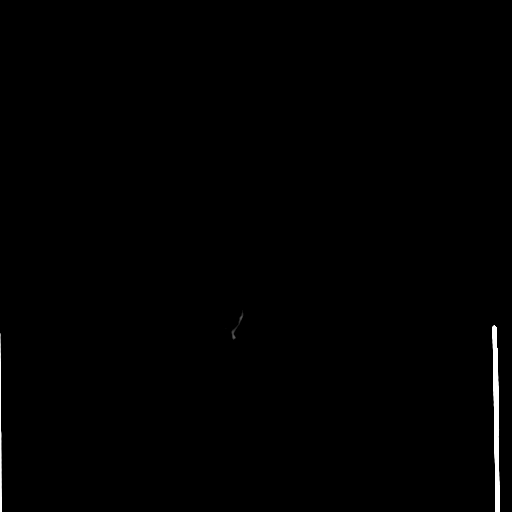
[im 34/36  brain]
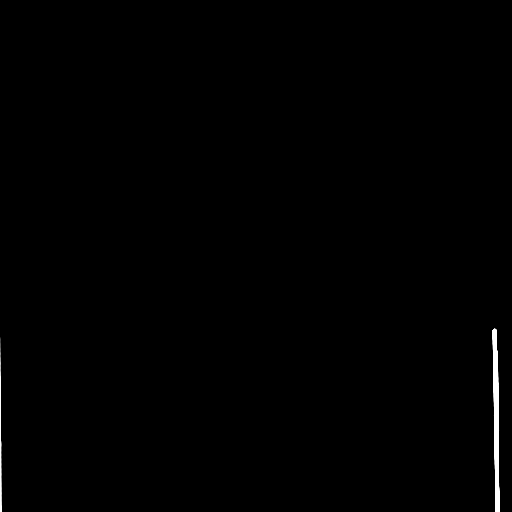

[16 of 30 positions shown; findings below may reference images not displayed]

FINDINGS: The ventricles are normal in size and configuration. There is no
mass, hemorrhage, extra-axial fluid collection, or midline shift.
Gray-white compartments appear normal. There is no apparent acute
infarct.

Bony calvarium appears intact.  Visualized mastoids are clear.
IMPRESSION: Study within normal limits.

## 2015-05-16 DIAGNOSIS — E663 Overweight: Secondary | ICD-10-CM | POA: Diagnosis not present

## 2015-05-16 DIAGNOSIS — I10 Essential (primary) hypertension: Secondary | ICD-10-CM | POA: Diagnosis not present

## 2015-05-16 DIAGNOSIS — G4700 Insomnia, unspecified: Secondary | ICD-10-CM | POA: Diagnosis not present

## 2015-05-16 DIAGNOSIS — Z6828 Body mass index (BMI) 28.0-28.9, adult: Secondary | ICD-10-CM | POA: Diagnosis not present

## 2015-05-16 DIAGNOSIS — Z0000 Encounter for general adult medical examination without abnormal findings: Secondary | ICD-10-CM | POA: Diagnosis not present

## 2015-05-16 DIAGNOSIS — C61 Malignant neoplasm of prostate: Secondary | ICD-10-CM | POA: Diagnosis not present

## 2015-05-29 DIAGNOSIS — C61 Malignant neoplasm of prostate: Secondary | ICD-10-CM | POA: Diagnosis not present

## 2015-05-29 DIAGNOSIS — N503 Cyst of epididymis: Secondary | ICD-10-CM | POA: Diagnosis not present

## 2015-05-29 DIAGNOSIS — N433 Hydrocele, unspecified: Secondary | ICD-10-CM | POA: Diagnosis not present

## 2015-05-29 DIAGNOSIS — R339 Retention of urine, unspecified: Secondary | ICD-10-CM | POA: Diagnosis not present

## 2015-08-05 DIAGNOSIS — S80869D Insect bite (nonvenomous), unspecified lower leg, subsequent encounter: Secondary | ICD-10-CM | POA: Diagnosis not present

## 2015-08-05 DIAGNOSIS — Z6828 Body mass index (BMI) 28.0-28.9, adult: Secondary | ICD-10-CM | POA: Diagnosis not present

## 2015-08-05 DIAGNOSIS — C61 Malignant neoplasm of prostate: Secondary | ICD-10-CM | POA: Diagnosis not present

## 2015-08-05 DIAGNOSIS — Z1389 Encounter for screening for other disorder: Secondary | ICD-10-CM | POA: Diagnosis not present

## 2015-08-05 DIAGNOSIS — I10 Essential (primary) hypertension: Secondary | ICD-10-CM | POA: Diagnosis not present

## 2015-08-05 DIAGNOSIS — N529 Male erectile dysfunction, unspecified: Secondary | ICD-10-CM | POA: Diagnosis not present

## 2015-09-12 DIAGNOSIS — N529 Male erectile dysfunction, unspecified: Secondary | ICD-10-CM | POA: Diagnosis not present

## 2015-09-12 DIAGNOSIS — N320 Bladder-neck obstruction: Secondary | ICD-10-CM | POA: Diagnosis not present

## 2015-09-12 DIAGNOSIS — C61 Malignant neoplasm of prostate: Secondary | ICD-10-CM | POA: Diagnosis not present

## 2015-09-12 DIAGNOSIS — R339 Retention of urine, unspecified: Secondary | ICD-10-CM | POA: Diagnosis not present

## 2015-09-12 DIAGNOSIS — N433 Hydrocele, unspecified: Secondary | ICD-10-CM | POA: Diagnosis not present

## 2015-12-11 DIAGNOSIS — N433 Hydrocele, unspecified: Secondary | ICD-10-CM | POA: Diagnosis not present

## 2015-12-11 DIAGNOSIS — C61 Malignant neoplasm of prostate: Secondary | ICD-10-CM | POA: Diagnosis not present

## 2015-12-11 DIAGNOSIS — N320 Bladder-neck obstruction: Secondary | ICD-10-CM | POA: Diagnosis not present

## 2015-12-11 DIAGNOSIS — N529 Male erectile dysfunction, unspecified: Secondary | ICD-10-CM | POA: Diagnosis not present

## 2015-12-11 DIAGNOSIS — R339 Retention of urine, unspecified: Secondary | ICD-10-CM | POA: Diagnosis not present

## 2016-03-18 DIAGNOSIS — N320 Bladder-neck obstruction: Secondary | ICD-10-CM | POA: Diagnosis not present

## 2016-03-18 DIAGNOSIS — C61 Malignant neoplasm of prostate: Secondary | ICD-10-CM | POA: Diagnosis not present

## 2016-03-18 DIAGNOSIS — R339 Retention of urine, unspecified: Secondary | ICD-10-CM | POA: Diagnosis not present

## 2016-09-30 DIAGNOSIS — N433 Hydrocele, unspecified: Secondary | ICD-10-CM | POA: Diagnosis not present

## 2016-09-30 DIAGNOSIS — R339 Retention of urine, unspecified: Secondary | ICD-10-CM | POA: Diagnosis not present

## 2016-09-30 DIAGNOSIS — N320 Bladder-neck obstruction: Secondary | ICD-10-CM | POA: Diagnosis not present

## 2016-09-30 DIAGNOSIS — C61 Malignant neoplasm of prostate: Secondary | ICD-10-CM | POA: Diagnosis not present

## 2016-11-27 DIAGNOSIS — J069 Acute upper respiratory infection, unspecified: Secondary | ICD-10-CM | POA: Diagnosis not present

## 2016-11-27 DIAGNOSIS — Z6828 Body mass index (BMI) 28.0-28.9, adult: Secondary | ICD-10-CM | POA: Diagnosis not present

## 2016-11-27 DIAGNOSIS — Z1389 Encounter for screening for other disorder: Secondary | ICD-10-CM | POA: Diagnosis not present

## 2016-11-27 DIAGNOSIS — J029 Acute pharyngitis, unspecified: Secondary | ICD-10-CM | POA: Diagnosis not present

## 2016-12-04 DIAGNOSIS — C61 Malignant neoplasm of prostate: Secondary | ICD-10-CM | POA: Diagnosis not present

## 2016-12-04 DIAGNOSIS — Z1389 Encounter for screening for other disorder: Secondary | ICD-10-CM | POA: Diagnosis not present

## 2016-12-04 DIAGNOSIS — Z0000 Encounter for general adult medical examination without abnormal findings: Secondary | ICD-10-CM | POA: Diagnosis not present

## 2016-12-04 DIAGNOSIS — Z6827 Body mass index (BMI) 27.0-27.9, adult: Secondary | ICD-10-CM | POA: Diagnosis not present

## 2016-12-04 DIAGNOSIS — E663 Overweight: Secondary | ICD-10-CM | POA: Diagnosis not present

## 2017-03-31 DIAGNOSIS — C61 Malignant neoplasm of prostate: Secondary | ICD-10-CM | POA: Diagnosis not present

## 2017-03-31 DIAGNOSIS — N320 Bladder-neck obstruction: Secondary | ICD-10-CM | POA: Diagnosis not present

## 2017-03-31 DIAGNOSIS — R339 Retention of urine, unspecified: Secondary | ICD-10-CM | POA: Diagnosis not present

## 2017-03-31 DIAGNOSIS — N433 Hydrocele, unspecified: Secondary | ICD-10-CM | POA: Diagnosis not present

## 2017-03-31 DIAGNOSIS — N529 Male erectile dysfunction, unspecified: Secondary | ICD-10-CM | POA: Diagnosis not present

## 2017-10-05 DIAGNOSIS — N320 Bladder-neck obstruction: Secondary | ICD-10-CM | POA: Diagnosis not present

## 2017-10-05 DIAGNOSIS — C61 Malignant neoplasm of prostate: Secondary | ICD-10-CM | POA: Diagnosis not present

## 2017-10-05 DIAGNOSIS — N433 Hydrocele, unspecified: Secondary | ICD-10-CM | POA: Diagnosis not present

## 2017-12-06 DIAGNOSIS — E782 Mixed hyperlipidemia: Secondary | ICD-10-CM | POA: Diagnosis not present

## 2017-12-06 DIAGNOSIS — Z23 Encounter for immunization: Secondary | ICD-10-CM | POA: Diagnosis not present

## 2017-12-06 DIAGNOSIS — Z1389 Encounter for screening for other disorder: Secondary | ICD-10-CM | POA: Diagnosis not present

## 2017-12-06 DIAGNOSIS — Z0000 Encounter for general adult medical examination without abnormal findings: Secondary | ICD-10-CM | POA: Diagnosis not present

## 2017-12-06 DIAGNOSIS — Z6826 Body mass index (BMI) 26.0-26.9, adult: Secondary | ICD-10-CM | POA: Diagnosis not present

## 2017-12-06 DIAGNOSIS — R7309 Other abnormal glucose: Secondary | ICD-10-CM | POA: Diagnosis not present

## 2018-08-26 DIAGNOSIS — Z6826 Body mass index (BMI) 26.0-26.9, adult: Secondary | ICD-10-CM | POA: Diagnosis not present

## 2018-08-26 DIAGNOSIS — E663 Overweight: Secondary | ICD-10-CM | POA: Diagnosis not present

## 2018-08-26 DIAGNOSIS — M5410 Radiculopathy, site unspecified: Secondary | ICD-10-CM | POA: Diagnosis not present

## 2018-08-26 DIAGNOSIS — I10 Essential (primary) hypertension: Secondary | ICD-10-CM | POA: Diagnosis not present

## 2018-08-26 DIAGNOSIS — M47816 Spondylosis without myelopathy or radiculopathy, lumbar region: Secondary | ICD-10-CM | POA: Diagnosis not present

## 2018-08-26 DIAGNOSIS — C61 Malignant neoplasm of prostate: Secondary | ICD-10-CM | POA: Diagnosis not present

## 2018-08-26 DIAGNOSIS — Z1389 Encounter for screening for other disorder: Secondary | ICD-10-CM | POA: Diagnosis not present

## 2018-08-26 DIAGNOSIS — M545 Low back pain: Secondary | ICD-10-CM | POA: Diagnosis not present

## 2018-09-23 DIAGNOSIS — C61 Malignant neoplasm of prostate: Secondary | ICD-10-CM | POA: Diagnosis not present

## 2018-09-23 DIAGNOSIS — K5289 Other specified noninfective gastroenteritis and colitis: Secondary | ICD-10-CM | POA: Diagnosis not present

## 2018-09-23 DIAGNOSIS — R1319 Other dysphagia: Secondary | ICD-10-CM | POA: Diagnosis not present

## 2018-09-23 DIAGNOSIS — K222 Esophageal obstruction: Secondary | ICD-10-CM | POA: Diagnosis not present

## 2018-09-23 DIAGNOSIS — E663 Overweight: Secondary | ICD-10-CM | POA: Diagnosis not present

## 2018-09-23 DIAGNOSIS — E782 Mixed hyperlipidemia: Secondary | ICD-10-CM | POA: Diagnosis not present

## 2018-09-23 DIAGNOSIS — R109 Unspecified abdominal pain: Secondary | ICD-10-CM | POA: Diagnosis not present

## 2018-09-23 DIAGNOSIS — E7849 Other hyperlipidemia: Secondary | ICD-10-CM | POA: Diagnosis not present

## 2018-09-23 DIAGNOSIS — I10 Essential (primary) hypertension: Secondary | ICD-10-CM | POA: Diagnosis not present

## 2018-09-23 DIAGNOSIS — Z6826 Body mass index (BMI) 26.0-26.9, adult: Secondary | ICD-10-CM | POA: Diagnosis not present

## 2018-09-27 ENCOUNTER — Other Ambulatory Visit (HOSPITAL_COMMUNITY): Payer: Self-pay | Admitting: Internal Medicine

## 2018-09-27 DIAGNOSIS — R4702 Dysphasia: Secondary | ICD-10-CM

## 2018-10-05 ENCOUNTER — Ambulatory Visit (HOSPITAL_COMMUNITY)
Admission: RE | Admit: 2018-10-05 | Discharge: 2018-10-05 | Disposition: A | Discharge status: Home / Self Care | Payer: Medicare HMO | Source: Ambulatory Visit | Attending: Internal Medicine | Admitting: Internal Medicine

## 2018-10-05 DIAGNOSIS — K224 Dyskinesia of esophagus: Secondary | ICD-10-CM | POA: Insufficient documentation

## 2018-10-05 DIAGNOSIS — R4702 Dysphasia: Secondary | ICD-10-CM | POA: Insufficient documentation

## 2018-10-05 DIAGNOSIS — K222 Esophageal obstruction: Secondary | ICD-10-CM | POA: Insufficient documentation

## 2018-10-05 DIAGNOSIS — K449 Diaphragmatic hernia without obstruction or gangrene: Secondary | ICD-10-CM | POA: Diagnosis not present

## 2018-10-07 DIAGNOSIS — K219 Gastro-esophageal reflux disease without esophagitis: Secondary | ICD-10-CM | POA: Diagnosis not present

## 2018-10-07 DIAGNOSIS — Z6826 Body mass index (BMI) 26.0-26.9, adult: Secondary | ICD-10-CM | POA: Diagnosis not present

## 2018-10-07 DIAGNOSIS — Z0001 Encounter for general adult medical examination with abnormal findings: Secondary | ICD-10-CM | POA: Diagnosis not present

## 2018-10-07 DIAGNOSIS — E782 Mixed hyperlipidemia: Secondary | ICD-10-CM | POA: Diagnosis not present

## 2018-10-07 DIAGNOSIS — K222 Esophageal obstruction: Secondary | ICD-10-CM | POA: Diagnosis not present

## 2018-10-07 DIAGNOSIS — Z1389 Encounter for screening for other disorder: Secondary | ICD-10-CM | POA: Diagnosis not present

## 2018-10-07 DIAGNOSIS — I10 Essential (primary) hypertension: Secondary | ICD-10-CM | POA: Diagnosis not present

## 2018-10-10 ENCOUNTER — Ambulatory Visit: Payer: Medicare PPO

## 2019-01-20 DIAGNOSIS — Z23 Encounter for immunization: Secondary | ICD-10-CM | POA: Diagnosis not present

## 2019-05-10 DIAGNOSIS — I10 Essential (primary) hypertension: Secondary | ICD-10-CM | POA: Diagnosis not present

## 2019-05-10 DIAGNOSIS — Z0000 Encounter for general adult medical examination without abnormal findings: Secondary | ICD-10-CM | POA: Diagnosis not present

## 2019-05-10 DIAGNOSIS — C61 Malignant neoplasm of prostate: Secondary | ICD-10-CM | POA: Diagnosis not present

## 2019-05-10 DIAGNOSIS — Z681 Body mass index (BMI) 19 or less, adult: Secondary | ICD-10-CM | POA: Diagnosis not present

## 2019-05-10 DIAGNOSIS — K219 Gastro-esophageal reflux disease without esophagitis: Secondary | ICD-10-CM | POA: Diagnosis not present

## 2019-05-10 DIAGNOSIS — E663 Overweight: Secondary | ICD-10-CM | POA: Diagnosis not present

## 2019-05-10 DIAGNOSIS — Z1389 Encounter for screening for other disorder: Secondary | ICD-10-CM | POA: Diagnosis not present

## 2019-05-10 DIAGNOSIS — G2581 Restless legs syndrome: Secondary | ICD-10-CM | POA: Diagnosis not present

## 2019-05-25 IMAGING — RF DG ESOPHAGUS
12 of 15 series · 12 of 24 positions shown · non-contrast
Comparison: None

CLINICAL DATA: Dysphagia, acid reflux, difficulty swallowing

EXAM:
ESOPHOGRAM / BARIUM SWALLOW / BARIUM TABLET STUDY
TECHNIQUE: Combined double contrast and single contrast examination performed
using effervescent crystals, thick barium liquid, and thin barium
liquid. The patient was observed with fluoroscopy swallowing a 13 mm
barium sulphate tablet.
FLUOROSCOPY TIME:  Fluoroscopy Time:  1 minutes 36 seconds
Radiation Exposure Index (if provided by the fluoroscopic device):
39.3 mGy
Number of Acquired Spot Images: multiple fluoroscopic screen
captures

[Series 1: cp_standard · 0.18mm/px · 1 of 16 frames shown (1 of 12)]
[frame 14/16]
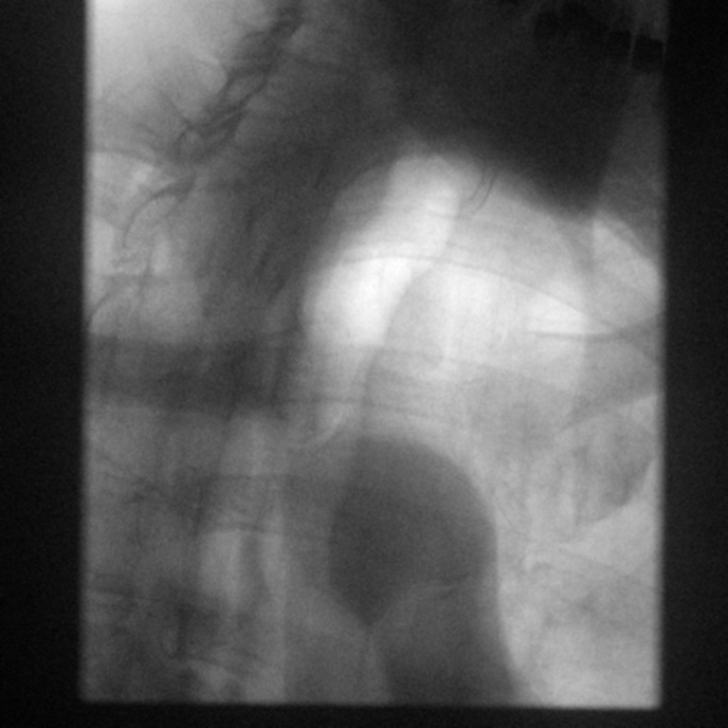

[Series 2: cp_standard · 0.18mm/px · 1 of 45 frames shown (2 of 12)]
[frame 39/45]
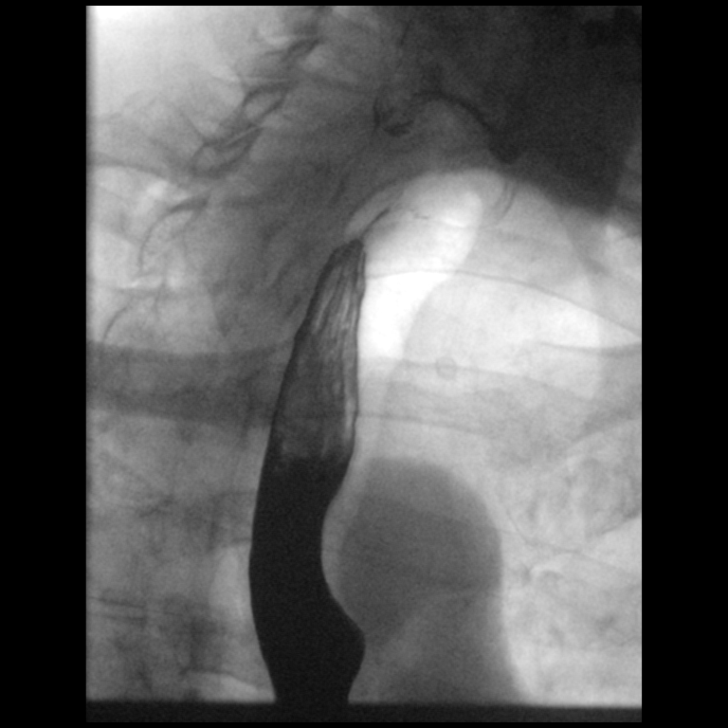

[Series 4: cp_standard · 0.18mm/px · 1 of 1 slices shown (3 of 12)]
[im 1/1]
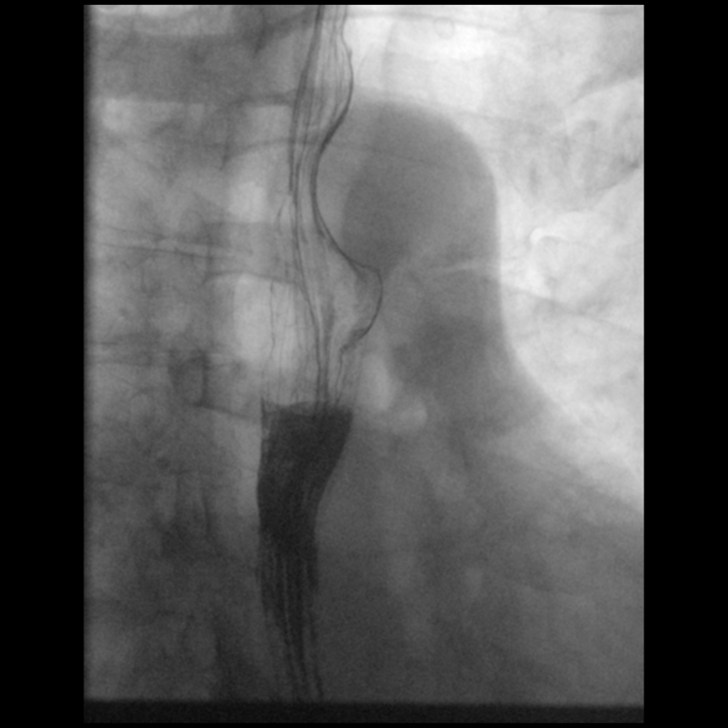

[Series 6: cp_standard · 0.19mm/px · 1 of 94 frames shown (4 of 12)]
[frame 10/94]
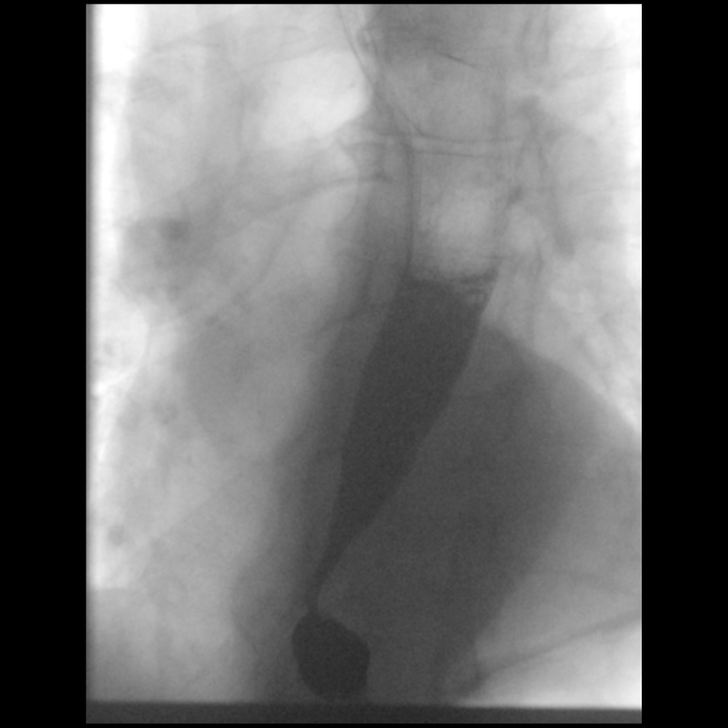

[Series 7: cp_standard · 0.27mm/px · 1 of 91 frames shown (5 of 12)]
[frame 14/91]
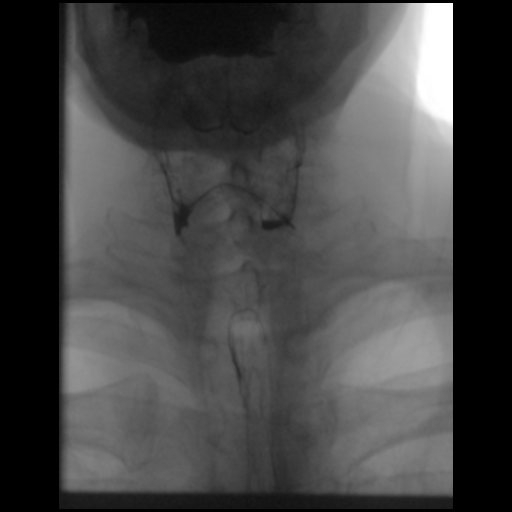

[Series 8: cp_standard · 0.25mm/px · 1 of 113 frames shown (6 of 12)]
[frame 17/113]
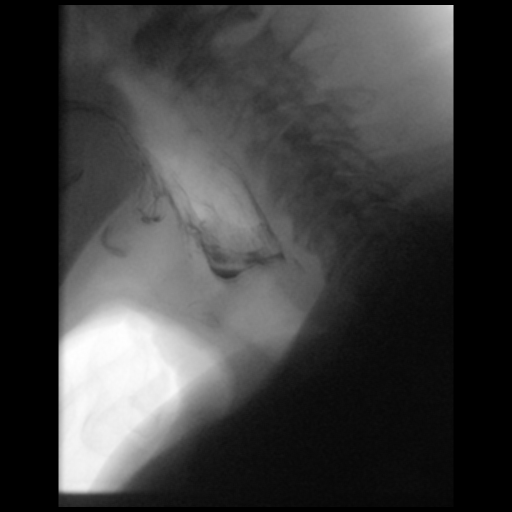

[Series 9: cp_standard · 0.25mm/px · 1 of 72 frames shown (7 of 12)]
[frame 62/72]
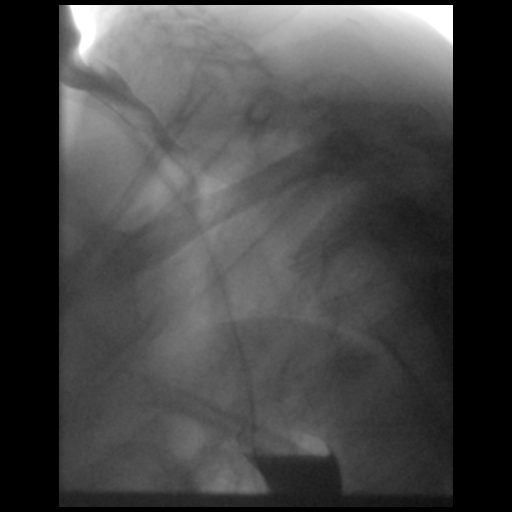

[Series 10: cp_standard · 0.26mm/px · 1 of 54 frames shown (8 of 12)]
[frame 46/54]
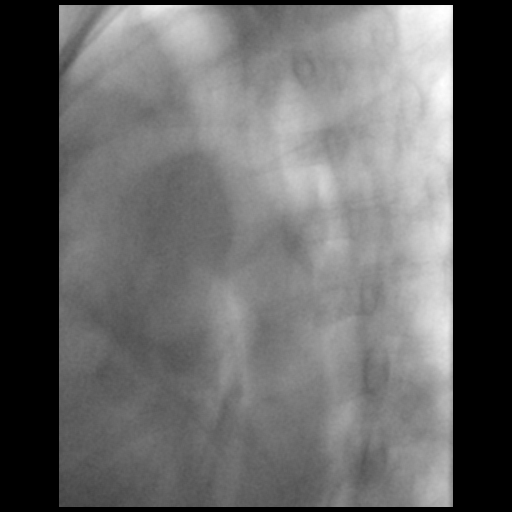

[Series 12: cp_standard · 0.18mm/px · 1 of 10 frames shown (9 of 12)]
[frame 2/10]
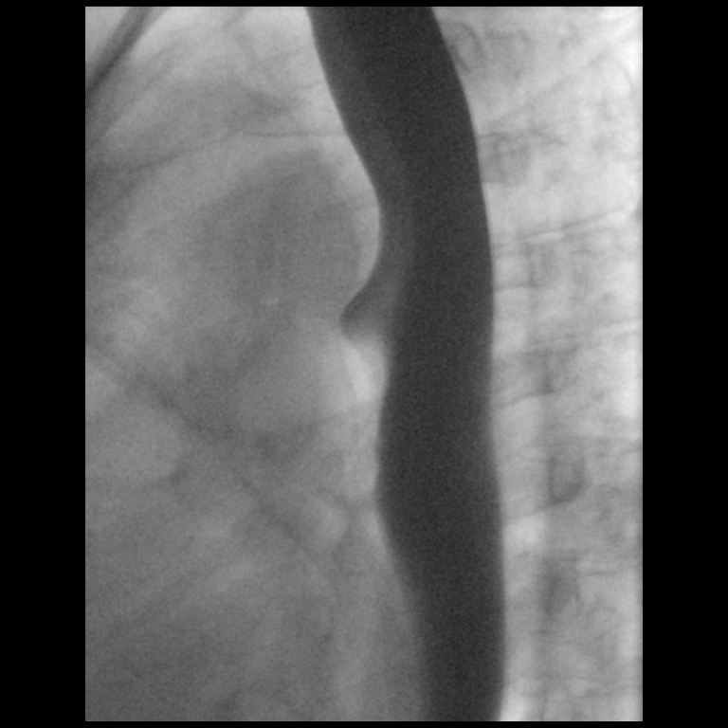

[Series 13: cp_standard · 0.18mm/px · 1 of 14 frames shown (10 of 12)]
[frame 3/14]
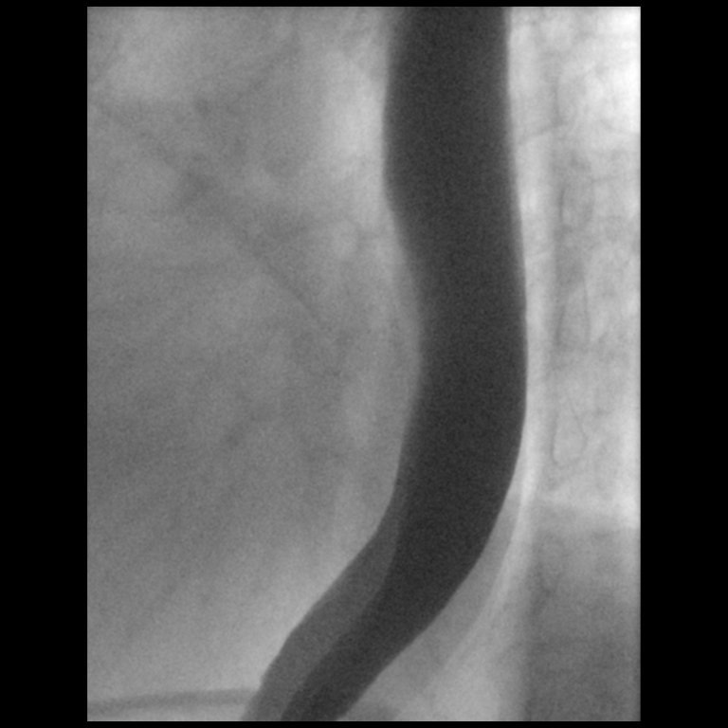

[Series 14: cp_standard · 0.18mm/px · 1 of 68 frames shown (11 of 12)]
[frame 41/68]
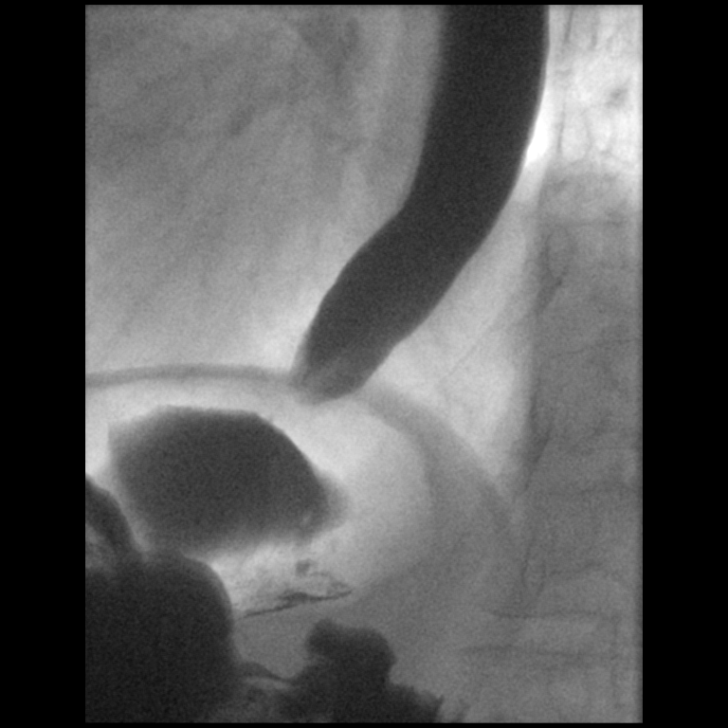

[Series 15: cp_standard · 0.18mm/px · 1 of 141 frames shown (12 of 12)]
[frame 120/141]
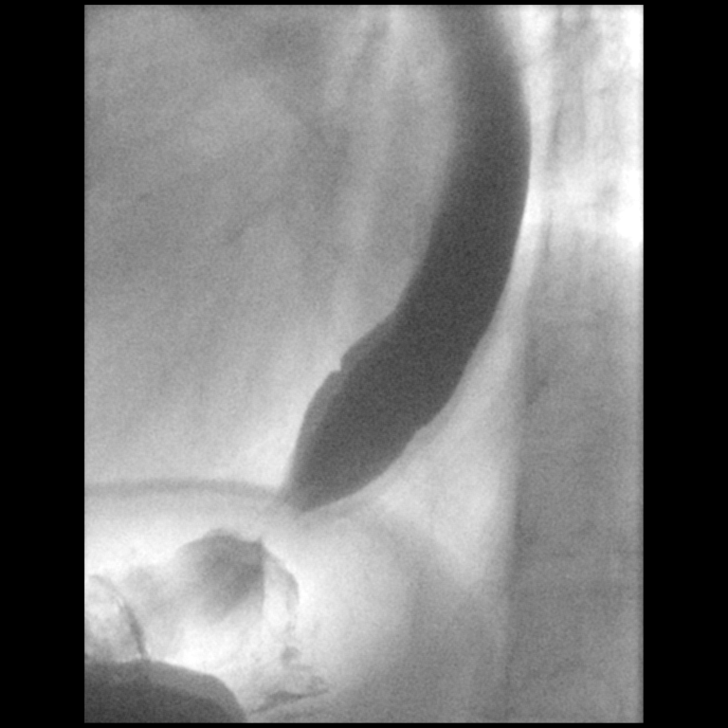

[12 of 24 positions shown; findings below may reference images not displayed]

FINDINGS: Esophageal distention: Normal without mass or stricture

Filling defects: Tiny partial mucosal ring/Schatzki ring at distal
esophagus, does not significantly narrow lumen

12.5 mm barium tablet: Easily passed from oral cavity to stomach
without delay

Motility: Moderate diffuse impairment of esophageal motility with
incomplete clearance of barium by primary peristaltic waves.
Scattered secondary and tertiary waves. Prolonged retention of
contrast in thoracic esophagus with patient horizontal RAO.

Mucosa:  Smooth without irregularity or ulceration

Hypopharynx/cervical esophagus: No laryngeal penetration, aspiration
or residuals

Hiatal hernia:  Tiny sliding hiatal hernia

GE reflux:  Not witnessed during exam

Other:  N/A
IMPRESSION: Moderate esophageal dysmotility.

Tiny partial Schatzki ring at distal esophagus, does not
significantly narrow lumen.

Tiny sliding hiatal hernia.

No evidence of esophageal mass or obstruction.

## 2019-06-02 DIAGNOSIS — E663 Overweight: Secondary | ICD-10-CM | POA: Diagnosis not present

## 2019-06-02 DIAGNOSIS — Z6826 Body mass index (BMI) 26.0-26.9, adult: Secondary | ICD-10-CM | POA: Diagnosis not present

## 2019-06-02 DIAGNOSIS — M5412 Radiculopathy, cervical region: Secondary | ICD-10-CM | POA: Diagnosis not present

## 2019-06-02 DIAGNOSIS — I10 Essential (primary) hypertension: Secondary | ICD-10-CM | POA: Diagnosis not present

## 2019-08-21 DIAGNOSIS — I10 Essential (primary) hypertension: Secondary | ICD-10-CM | POA: Diagnosis not present

## 2019-08-21 DIAGNOSIS — E782 Mixed hyperlipidemia: Secondary | ICD-10-CM | POA: Diagnosis not present

## 2019-08-21 DIAGNOSIS — C61 Malignant neoplasm of prostate: Secondary | ICD-10-CM | POA: Diagnosis not present

## 2019-08-21 DIAGNOSIS — E663 Overweight: Secondary | ICD-10-CM | POA: Diagnosis not present

## 2019-08-25 DIAGNOSIS — M5412 Radiculopathy, cervical region: Secondary | ICD-10-CM | POA: Diagnosis not present

## 2019-08-25 DIAGNOSIS — M47812 Spondylosis without myelopathy or radiculopathy, cervical region: Secondary | ICD-10-CM | POA: Diagnosis not present

## 2019-08-25 DIAGNOSIS — K219 Gastro-esophageal reflux disease without esophagitis: Secondary | ICD-10-CM | POA: Diagnosis not present

## 2019-08-25 DIAGNOSIS — Z6826 Body mass index (BMI) 26.0-26.9, adult: Secondary | ICD-10-CM | POA: Diagnosis not present

## 2019-08-25 DIAGNOSIS — I10 Essential (primary) hypertension: Secondary | ICD-10-CM | POA: Diagnosis not present

## 2019-09-13 DIAGNOSIS — H179 Unspecified corneal scar and opacity: Secondary | ICD-10-CM | POA: Diagnosis not present

## 2019-09-13 DIAGNOSIS — H40013 Open angle with borderline findings, low risk, bilateral: Secondary | ICD-10-CM | POA: Diagnosis not present

## 2019-09-13 DIAGNOSIS — H1045 Other chronic allergic conjunctivitis: Secondary | ICD-10-CM | POA: Diagnosis not present

## 2019-09-13 DIAGNOSIS — H25812 Combined forms of age-related cataract, left eye: Secondary | ICD-10-CM | POA: Diagnosis not present

## 2019-09-13 DIAGNOSIS — Z961 Presence of intraocular lens: Secondary | ICD-10-CM | POA: Diagnosis not present

## 2019-09-13 DIAGNOSIS — H26491 Other secondary cataract, right eye: Secondary | ICD-10-CM | POA: Diagnosis not present

## 2019-09-13 DIAGNOSIS — H35033 Hypertensive retinopathy, bilateral: Secondary | ICD-10-CM | POA: Diagnosis not present

## 2019-09-20 DIAGNOSIS — E7849 Other hyperlipidemia: Secondary | ICD-10-CM | POA: Diagnosis not present

## 2019-09-20 DIAGNOSIS — I10 Essential (primary) hypertension: Secondary | ICD-10-CM | POA: Diagnosis not present

## 2019-12-11 DIAGNOSIS — E663 Overweight: Secondary | ICD-10-CM | POA: Diagnosis not present

## 2019-12-11 DIAGNOSIS — R1013 Epigastric pain: Secondary | ICD-10-CM | POA: Diagnosis not present

## 2019-12-11 DIAGNOSIS — Z6826 Body mass index (BMI) 26.0-26.9, adult: Secondary | ICD-10-CM | POA: Diagnosis not present

## 2019-12-12 ENCOUNTER — Encounter (HOSPITAL_COMMUNITY): Payer: Self-pay

## 2019-12-12 ENCOUNTER — Other Ambulatory Visit: Payer: Self-pay

## 2019-12-12 ENCOUNTER — Emergency Department (HOSPITAL_COMMUNITY)
Admission: EM | Admit: 2019-12-12 | Discharge: 2019-12-12 | Disposition: A | Discharge status: Home / Self Care | Payer: Medicare HMO | Attending: Emergency Medicine | Admitting: Emergency Medicine

## 2019-12-12 ENCOUNTER — Emergency Department (HOSPITAL_COMMUNITY): Payer: Medicare HMO

## 2019-12-12 DIAGNOSIS — I10 Essential (primary) hypertension: Secondary | ICD-10-CM | POA: Insufficient documentation

## 2019-12-12 DIAGNOSIS — R1032 Left lower quadrant pain: Secondary | ICD-10-CM

## 2019-12-12 DIAGNOSIS — N131 Hydronephrosis with ureteral stricture, not elsewhere classified: Secondary | ICD-10-CM | POA: Diagnosis not present

## 2019-12-12 DIAGNOSIS — R1110 Vomiting, unspecified: Secondary | ICD-10-CM | POA: Insufficient documentation

## 2019-12-12 DIAGNOSIS — N201 Calculus of ureter: Secondary | ICD-10-CM | POA: Diagnosis not present

## 2019-12-12 DIAGNOSIS — R109 Unspecified abdominal pain: Secondary | ICD-10-CM | POA: Diagnosis not present

## 2019-12-12 LAB — CBC WITH DIFFERENTIAL/PLATELET
Abs Immature Granulocytes: 0.01 10*3/uL (ref 0.00–0.07)
Basophils Absolute: 0 10*3/uL (ref 0.0–0.1)
Basophils Relative: 0 %
Eosinophils Absolute: 0 10*3/uL (ref 0.0–0.5)
Eosinophils Relative: 1 %
HCT: 39.9 % (ref 39.0–52.0)
Hemoglobin: 12.7 g/dL — ABNORMAL LOW (ref 13.0–17.0)
Immature Granulocytes: 0 %
Lymphocytes Relative: 12 %
Lymphs Abs: 0.6 10*3/uL — ABNORMAL LOW (ref 0.7–4.0)
MCH: 32.2 pg (ref 26.0–34.0)
MCHC: 31.8 g/dL (ref 30.0–36.0)
MCV: 101.3 fL — ABNORMAL HIGH (ref 80.0–100.0)
Monocytes Absolute: 0.7 10*3/uL (ref 0.1–1.0)
Monocytes Relative: 12 %
Neutro Abs: 4.1 10*3/uL (ref 1.7–7.7)
Neutrophils Relative %: 75 %
Platelets: 218 10*3/uL (ref 150–400)
RBC: 3.94 MIL/uL — ABNORMAL LOW (ref 4.22–5.81)
RDW: 13.4 % (ref 11.5–15.5)
WBC: 5.4 10*3/uL (ref 4.0–10.5)
nRBC: 0 % (ref 0.0–0.2)

## 2019-12-12 LAB — COMPREHENSIVE METABOLIC PANEL
ALT: 27 U/L (ref 0–44)
AST: 25 U/L (ref 15–41)
Albumin: 4 g/dL (ref 3.5–5.0)
Alkaline Phosphatase: 48 U/L (ref 38–126)
Anion gap: 10 (ref 5–15)
BUN: 19 mg/dL (ref 8–23)
CO2: 26 mmol/L (ref 22–32)
Calcium: 8.8 mg/dL — ABNORMAL LOW (ref 8.9–10.3)
Chloride: 103 mmol/L (ref 98–111)
Creatinine, Ser: 1.97 mg/dL — ABNORMAL HIGH (ref 0.61–1.24)
GFR calc Af Amer: 38 mL/min — ABNORMAL LOW (ref >60)
GFR calc non Af Amer: 33 mL/min — ABNORMAL LOW (ref >60)
Glucose, Bld: 100 mg/dL — ABNORMAL HIGH (ref 70–99)
Potassium: 3.7 mmol/L (ref 3.5–5.1)
Sodium: 139 mmol/L (ref 135–145)
Total Bilirubin: 0.7 mg/dL (ref 0.3–1.2)
Total Protein: 7.5 g/dL (ref 6.5–8.1)

## 2019-12-12 LAB — URINALYSIS, ROUTINE W REFLEX MICROSCOPIC
Bacteria, UA: NONE SEEN
Bilirubin Urine: NEGATIVE
Glucose, UA: NEGATIVE mg/dL
Ketones, ur: NEGATIVE mg/dL
Leukocytes,Ua: NEGATIVE
Nitrite: NEGATIVE
Protein, ur: NEGATIVE mg/dL
Specific Gravity, Urine: 1.016 (ref 1.005–1.030)
pH: 7 (ref 5.0–8.0)

## 2019-12-12 LAB — LIPASE, BLOOD: Lipase: 24 U/L (ref 11–51)

## 2019-12-12 MED ORDER — TRAMADOL HCL 50 MG PO TABS: 50.0000 mg | ORAL_TABLET | Freq: Four times a day (QID) | ORAL | 0 refills | Status: DC | PRN

## 2019-12-12 MED ORDER — SUCRALFATE 1 G PO TABS: 1.0000 g | ORAL_TABLET | Freq: Three times a day (TID) | ORAL | 0 refills | Status: DC

## 2019-12-12 MED ORDER — TAMSULOSIN HCL 0.4 MG PO CAPS: 0.4000 mg | ORAL_CAPSULE | Freq: Every day | ORAL | 0 refills | Status: AC

## 2019-12-12 MED ORDER — IOHEXOL 300 MG/ML  SOLN
75.0000 mL | Freq: Once | INTRAMUSCULAR | Status: AC | PRN
  Administered 2019-12-12: 13:00:00 75 mL via INTRAVENOUS

## 2019-12-12 MED ORDER — ONDANSETRON 4 MG PO TBDP: 4.0000 mg | ORAL_TABLET | Freq: Three times a day (TID) | ORAL | 0 refills | Status: DC | PRN

## 2019-12-12 NOTE — ED Provider Notes (Signed)
Emergency Department Provider Note   I have reviewed the triage vital signs and the nursing notes.   HISTORY  Chief Complaint Abdominal Pain   HPI John Clarke is a 73 y.o. male with PMH HTN and GERD presents to the ED for evaluation of abdominal pain for the last 3-4 days. Patient reports a mid-abdominal pain radiation now to the left which "feels like gas" but has become increasingly severe. He reports some vomiting but denies diarrhea. He continues to have BMs. Pain is "ok" when not eating but becomes more severe with anything PO. Notes a remote history PUD but states that this was treated and does not feel similar to that pain. Denies any h/o black or BRB in the BMs. No fever, chills. He was started on PO abx by his PCP yesterday but reports no relief and does not recall the name of the meds.   Past Medical History:  Diagnosis Date  . Arthritis   . GERD (gastroesophageal reflux disease)   . History of stomach ulcers   . Hypertension     Patient Active Problem List   Diagnosis Date Noted  . SHOULDER PAIN 10/24/2008    Past Surgical History:  Procedure Laterality Date  . CATARACT EXTRACTION W/PHACO  10/27/2012   Procedure: CATARACT EXTRACTION PHACO AND INTRAOCULAR LENS PLACEMENT (IOC);  Surgeon: Tonny Branch, MD;  Location: [facility];  Service: Ophthalmology;  Laterality: Right;  CDE:10.68  . right arm bicep  14 years ago    Allergies Patient has no known allergies.  No family history on file.  Social History Social History   Tobacco Use  . Smoking status: Never Smoker  . Smokeless tobacco: Never Used  Substance Use Topics  . Alcohol use: No  . Drug use: No    Review of Systems  Constitutional: No fever/chills Eyes: No visual changes. ENT: No sore throat. Cardiovascular: Denies chest pain. Respiratory: Denies shortness of breath. Gastrointestinal: Positive abdominal pain. Intermittent nausea, positive vomiting.  No diarrhea.  No  constipation. Genitourinary: Negative for dysuria. Musculoskeletal: Negative for back pain. Skin: Negative for rash. Neurological: Negative for headaches, focal weakness or numbness.  10-point ROS otherwise negative.  ____________________________________________   PHYSICAL EXAM:  VITAL SIGNS: ED Triage Vitals  Enc Vitals Group     BP 12/12/19 1105 139/88     Pulse Rate 12/12/19 1105 70     Resp 12/12/19 1105 16     Temp 12/12/19 1105 98.3 F (36.8 C)     Temp Source 12/12/19 1105 Oral     SpO2 12/12/19 1105 98 %     Weight 12/12/19 1106 203 lb (92.1 kg)     Height 12/12/19 1106 6\' 3"  (1.905 m)   Constitutional: Alert and oriented. Well appearing and in no acute distress. Eyes: Conjunctivae are normal.  Head: Atraumatic. Nose: No congestion/rhinnorhea. Mouth/Throat: Mucous membranes are moist.   Neck: No stridor.   Cardiovascular: Normal rate, regular rhythm. Good peripheral circulation. Grossly normal heart sounds.   Respiratory: Normal respiratory effort.  No retractions. Lungs CTAB. Gastrointestinal: Soft with mild left mid-abdominal and periumbilical tenderness. No rebound or guarding. No distention.  Musculoskeletal:  No gross deformities of extremities. Neurologic:  Normal speech and language.  Skin:  Skin is warm, dry and intact. No rash noted.   ____________________________________________   LABS (all labs ordered are listed, but only abnormal results are displayed)  Labs Reviewed  COMPREHENSIVE METABOLIC PANEL - Abnormal; Notable for the following components:  Result Value   Glucose, Bld 100 (*)    Creatinine, Ser 1.97 (*)    Calcium 8.8 (*)    GFR calc non Af Amer 33 (*)    GFR calc Af Amer 38 (*)    All other components within normal limits  CBC WITH DIFFERENTIAL/PLATELET - Abnormal; Notable for the following components:   RBC 3.94 (*)    Hemoglobin 12.7 (*)    MCV 101.3 (*)    Lymphs Abs 0.6 (*)    All other components within normal limits   URINALYSIS, ROUTINE W REFLEX MICROSCOPIC - Abnormal; Notable for the following components:   Color, Urine COLORLESS (*)    Hgb urine dipstick SMALL (*)    All other components within normal limits  LIPASE, BLOOD   ____________________________________________  RADIOLOGY  CT abdomen/pelvis reviewed.  ____________________________________________   PROCEDURES  Procedure(s) performed:   Procedures  None ____________________________________________   INITIAL IMPRESSION / ASSESSMENT AND PLAN / ED COURSE  Pertinent labs & imaging results that were available during my care of the patient were reviewed by me and considered in my medical decision making (see chart for details).   Patient presents to the emergency department for evaluation of abdominal pain over the past several days.  He has been abdomen slightly left-sided abdominal tenderness.  Pain worse with eating.  Question possible pancreatitis but he has not higher risk for this.  Plan for CT abdomen pelvis, labs, reassess.  CT with likely passed stone now in the bladder with inflammation on the left which correlates with pain. Labs reviewed. Pain improved. Plan for discharge with supportive care and Urology follow up.  ____________________________________________  FINAL CLINICAL IMPRESSION(S) / ED DIAGNOSES  Final diagnoses:  Ureterolithiasis  Left lower quadrant abdominal pain     MEDICATIONS GIVEN DURING THIS VISIT:  Medications  iohexol (OMNIPAQUE) 300 MG/ML solution 75 mL (75 mLs Intravenous Contrast Given 12/12/19 1233)     NEW OUTPATIENT MEDICATIONS STARTED DURING THIS VISIT:  Discharge Medication List as of 12/12/2019  2:39 PM    START taking these medications   Details  ondansetron (ZOFRAN ODT) 4 MG disintegrating tablet Take 1 tablet (4 mg total) by mouth every 8 (eight) hours as needed., Starting Tue 12/12/2019, Normal    sucralfate (CARAFATE) 1 g tablet Take 1 tablet (1 g total) by mouth 4 (four)  times daily -  with meals and at bedtime for 7 days., Starting Tue 12/12/2019, Until Tue 12/19/2019, Normal    tamsulosin (FLOMAX) 0.4 MG CAPS capsule Take 1 capsule (0.4 mg total) by mouth daily for 7 days., Starting Tue 12/12/2019, Until Tue 12/19/2019, Normal    traMADol (ULTRAM) 50 MG tablet Take 1 tablet (50 mg total) by mouth every 6 (six) hours as needed for severe pain., Starting Tue 12/12/2019, Normal        Note:  This document was prepared using Dragon voice recognition software and may include unintentional dictation errors.  Nanda Quinton, MD, [facility] Emergency Medicine   Shykeem Resurreccion, [facility], MD 12/14/19 [phone]

## 2019-12-12 NOTE — Discharge Instructions (Signed)
You were seen in the emergency department today with left-sided abdominal pain.  We did a CT scan and found that a kidney stone had recently passed on the left side which is likely causing your severe pain.  I have provided several medications to take as needed for symptoms.  Tramadol may cause drowsiness and should be taken only as needed for severe pain.  Please call the urologist to schedule a follow-up appointment.  Return to the emergency department with any new or suddenly worsening symptoms.

## 2019-12-12 NOTE — ED Notes (Signed)
Pt verbalizes understanding of follow up care, medication regimen, and discharge instructions, pt verbalizes having the opportunity to have his questions answered

## 2019-12-12 NOTE — ED Triage Notes (Signed)
Pt has been having abdominal pain since Sunday. Feels like gas. Went to PCP yesterday and was given antibiotics for stomach. States they are not working. Patient is not sure why he was prescribed antibiotics.

## 2020-01-10 DIAGNOSIS — E538 Deficiency of other specified B group vitamins: Secondary | ICD-10-CM | POA: Diagnosis not present

## 2020-01-10 DIAGNOSIS — Z125 Encounter for screening for malignant neoplasm of prostate: Secondary | ICD-10-CM | POA: Diagnosis not present

## 2020-01-10 DIAGNOSIS — K219 Gastro-esophageal reflux disease without esophagitis: Secondary | ICD-10-CM | POA: Diagnosis not present

## 2020-01-10 DIAGNOSIS — E7849 Other hyperlipidemia: Secondary | ICD-10-CM | POA: Diagnosis not present

## 2020-01-10 DIAGNOSIS — C61 Malignant neoplasm of prostate: Secondary | ICD-10-CM | POA: Diagnosis not present

## 2020-01-10 DIAGNOSIS — E782 Mixed hyperlipidemia: Secondary | ICD-10-CM | POA: Diagnosis not present

## 2020-01-10 DIAGNOSIS — N529 Male erectile dysfunction, unspecified: Secondary | ICD-10-CM | POA: Diagnosis not present

## 2020-01-10 DIAGNOSIS — Z0001 Encounter for general adult medical examination with abnormal findings: Secondary | ICD-10-CM | POA: Diagnosis not present

## 2020-01-10 DIAGNOSIS — R5383 Other fatigue: Secondary | ICD-10-CM | POA: Diagnosis not present

## 2020-01-10 DIAGNOSIS — Z1389 Encounter for screening for other disorder: Secondary | ICD-10-CM | POA: Diagnosis not present

## 2020-01-10 DIAGNOSIS — D649 Anemia, unspecified: Secondary | ICD-10-CM | POA: Diagnosis not present

## 2020-01-10 DIAGNOSIS — I10 Essential (primary) hypertension: Secondary | ICD-10-CM | POA: Diagnosis not present

## 2020-01-10 DIAGNOSIS — E663 Overweight: Secondary | ICD-10-CM | POA: Diagnosis not present

## 2020-01-10 DIAGNOSIS — Z6826 Body mass index (BMI) 26.0-26.9, adult: Secondary | ICD-10-CM | POA: Diagnosis not present

## 2020-02-18 DIAGNOSIS — I10 Essential (primary) hypertension: Secondary | ICD-10-CM | POA: Diagnosis not present

## 2020-02-18 DIAGNOSIS — E7849 Other hyperlipidemia: Secondary | ICD-10-CM | POA: Diagnosis not present

## 2020-02-20 ENCOUNTER — Ambulatory Visit: Payer: Medicare HMO | Attending: Internal Medicine

## 2020-02-20 DIAGNOSIS — Z23 Encounter for immunization: Secondary | ICD-10-CM

## 2020-02-20 NOTE — Progress Notes (Signed)
   Covid-19 Vaccination Clinic  Name:  KAZIM PIERMAN    MRN: [id number] DOB: [birth year]  02/20/2020  Mr. Petta was observed post Covid-19 immunization for 15 minutes without incident. He was provided with Vaccine Information Sheet and instruction to access the V-Safe system.   Mr. Pignatelli was instructed to call 911 with any severe reactions post vaccine: Marland Kitchen Difficulty breathing  . Swelling of face and throat  . A fast heartbeat  . A bad rash all over body  . Dizziness and weakness   Immunizations Administered    Name Date Dose VIS Date Route   Moderna COVID-19 Vaccine 02/20/2020  2:11 PM 0.5 mL 11/21/2019 Intramuscular   Manufacturer: Moderna   Lot: [id number]   [id number]

## 2020-03-19 ENCOUNTER — Ambulatory Visit: Payer: Medicare HMO | Attending: Internal Medicine

## 2020-03-19 DIAGNOSIS — Z23 Encounter for immunization: Secondary | ICD-10-CM

## 2020-03-19 NOTE — Progress Notes (Signed)
   Covid-[facility] Vaccination [facility]  Name:  John Clarke    MRN: [id number] DOB: [birth year]  03/19/2020  Mr. John Clarke was observed post Covid-19 immunization for 15 minutes without incident. He was provided with Vaccine Information Sheet and instruction to access the V-Safe system.   Mr. John Clarke was instructed to call 911 with any severe reactions post vaccine: Marland Kitchen Difficulty breathing  . Swelling of face and throat  . A fast heartbeat  . A bad rash all over body  . Dizziness and weakness   Immunizations Administered    Name Date Dose VIS Date Route   Moderna COVID-19 Vaccine 03/19/2020  1:48 PM 0.5 mL 11/21/2019 Intramuscular   Manufacturer: Moderna   Lot: [id number]   [id number]

## 2020-04-19 DIAGNOSIS — E7849 Other hyperlipidemia: Secondary | ICD-10-CM | POA: Diagnosis not present

## 2020-04-19 DIAGNOSIS — I10 Essential (primary) hypertension: Secondary | ICD-10-CM | POA: Diagnosis not present

## 2020-06-19 DIAGNOSIS — I10 Essential (primary) hypertension: Secondary | ICD-10-CM | POA: Diagnosis not present

## 2020-06-19 DIAGNOSIS — E7849 Other hyperlipidemia: Secondary | ICD-10-CM | POA: Diagnosis not present

## 2020-06-19 DIAGNOSIS — K219 Gastro-esophageal reflux disease without esophagitis: Secondary | ICD-10-CM | POA: Diagnosis not present

## 2020-08-20 DIAGNOSIS — K219 Gastro-esophageal reflux disease without esophagitis: Secondary | ICD-10-CM | POA: Diagnosis not present

## 2020-08-20 DIAGNOSIS — I10 Essential (primary) hypertension: Secondary | ICD-10-CM | POA: Diagnosis not present

## 2020-08-20 DIAGNOSIS — E7849 Other hyperlipidemia: Secondary | ICD-10-CM | POA: Diagnosis not present

## 2020-09-19 DIAGNOSIS — I10 Essential (primary) hypertension: Secondary | ICD-10-CM | POA: Diagnosis not present

## 2020-09-19 DIAGNOSIS — E7849 Other hyperlipidemia: Secondary | ICD-10-CM | POA: Diagnosis not present

## 2020-10-19 DIAGNOSIS — I10 Essential (primary) hypertension: Secondary | ICD-10-CM | POA: Diagnosis not present

## 2020-10-19 DIAGNOSIS — E7849 Other hyperlipidemia: Secondary | ICD-10-CM | POA: Diagnosis not present

## 2020-11-19 DIAGNOSIS — E7849 Other hyperlipidemia: Secondary | ICD-10-CM | POA: Diagnosis not present

## 2020-11-19 DIAGNOSIS — I10 Essential (primary) hypertension: Secondary | ICD-10-CM | POA: Diagnosis not present

## 2020-12-20 DIAGNOSIS — I10 Essential (primary) hypertension: Secondary | ICD-10-CM | POA: Diagnosis not present

## 2020-12-20 DIAGNOSIS — E7849 Other hyperlipidemia: Secondary | ICD-10-CM | POA: Diagnosis not present

## 2021-02-17 DIAGNOSIS — E7849 Other hyperlipidemia: Secondary | ICD-10-CM | POA: Diagnosis not present

## 2021-02-17 DIAGNOSIS — I10 Essential (primary) hypertension: Secondary | ICD-10-CM | POA: Diagnosis not present

## 2021-04-19 DIAGNOSIS — E7849 Other hyperlipidemia: Secondary | ICD-10-CM | POA: Diagnosis not present

## 2021-04-19 DIAGNOSIS — I10 Essential (primary) hypertension: Secondary | ICD-10-CM | POA: Diagnosis not present

## 2021-05-20 DIAGNOSIS — E7849 Other hyperlipidemia: Secondary | ICD-10-CM | POA: Diagnosis not present

## 2021-05-20 DIAGNOSIS — I10 Essential (primary) hypertension: Secondary | ICD-10-CM | POA: Diagnosis not present

## 2021-06-19 DIAGNOSIS — E7849 Other hyperlipidemia: Secondary | ICD-10-CM | POA: Diagnosis not present

## 2021-06-19 DIAGNOSIS — I10 Essential (primary) hypertension: Secondary | ICD-10-CM | POA: Diagnosis not present

## 2021-07-08 DIAGNOSIS — Z1331 Encounter for screening for depression: Secondary | ICD-10-CM | POA: Diagnosis not present

## 2021-07-08 DIAGNOSIS — Z1389 Encounter for screening for other disorder: Secondary | ICD-10-CM | POA: Diagnosis not present

## 2021-07-08 DIAGNOSIS — C61 Malignant neoplasm of prostate: Secondary | ICD-10-CM | POA: Diagnosis not present

## 2021-07-08 DIAGNOSIS — I10 Essential (primary) hypertension: Secondary | ICD-10-CM | POA: Diagnosis not present

## 2021-07-08 DIAGNOSIS — E663 Overweight: Secondary | ICD-10-CM | POA: Diagnosis not present

## 2021-07-08 DIAGNOSIS — Z6826 Body mass index (BMI) 26.0-26.9, adult: Secondary | ICD-10-CM | POA: Diagnosis not present

## 2021-07-08 DIAGNOSIS — Z0001 Encounter for general adult medical examination with abnormal findings: Secondary | ICD-10-CM | POA: Diagnosis not present

## 2021-07-08 DIAGNOSIS — E782 Mixed hyperlipidemia: Secondary | ICD-10-CM | POA: Diagnosis not present

## 2021-07-08 DIAGNOSIS — M1991 Primary osteoarthritis, unspecified site: Secondary | ICD-10-CM | POA: Diagnosis not present

## 2022-09-08 DIAGNOSIS — I10 Essential (primary) hypertension: Secondary | ICD-10-CM | POA: Diagnosis not present

## 2022-09-08 DIAGNOSIS — D518 Other vitamin B12 deficiency anemias: Secondary | ICD-10-CM | POA: Diagnosis not present

## 2022-09-08 DIAGNOSIS — C61 Malignant neoplasm of prostate: Secondary | ICD-10-CM | POA: Diagnosis not present

## 2022-09-08 DIAGNOSIS — E782 Mixed hyperlipidemia: Secondary | ICD-10-CM | POA: Diagnosis not present

## 2022-09-08 DIAGNOSIS — M67912 Unspecified disorder of synovium and tendon, left shoulder: Secondary | ICD-10-CM | POA: Diagnosis not present

## 2022-09-08 DIAGNOSIS — E663 Overweight: Secondary | ICD-10-CM | POA: Diagnosis not present

## 2022-09-08 DIAGNOSIS — Z125 Encounter for screening for malignant neoplasm of prostate: Secondary | ICD-10-CM | POA: Diagnosis not present

## 2022-09-08 DIAGNOSIS — R7309 Other abnormal glucose: Secondary | ICD-10-CM | POA: Diagnosis not present

## 2022-09-08 DIAGNOSIS — E039 Hypothyroidism, unspecified: Secondary | ICD-10-CM | POA: Diagnosis not present

## 2022-09-08 DIAGNOSIS — K219 Gastro-esophageal reflux disease without esophagitis: Secondary | ICD-10-CM | POA: Diagnosis not present

## 2022-09-08 DIAGNOSIS — Z0001 Encounter for general adult medical examination with abnormal findings: Secondary | ICD-10-CM | POA: Diagnosis not present

## 2022-09-08 DIAGNOSIS — M1991 Primary osteoarthritis, unspecified site: Secondary | ICD-10-CM | POA: Diagnosis not present

## 2022-09-08 DIAGNOSIS — E559 Vitamin D deficiency, unspecified: Secondary | ICD-10-CM | POA: Diagnosis not present

## 2023-08-04 ENCOUNTER — Ambulatory Visit
Admission: EM | Admit: 2023-08-04 | Discharge: 2023-08-04 | Disposition: A | Discharge status: Home / Self Care | Payer: Medicare HMO | Attending: Family Medicine | Admitting: Family Medicine

## 2023-08-04 ENCOUNTER — Encounter: Payer: Self-pay | Admitting: Emergency Medicine

## 2023-08-04 DIAGNOSIS — U071 COVID-19: Secondary | ICD-10-CM | POA: Diagnosis not present

## 2023-08-04 MED ORDER — MOLNUPIRAVIR EUA 200MG CAPSULE: 4.0000 | ORAL_CAPSULE | Freq: Two times a day (BID) | ORAL | 0 refills | Status: AC

## 2023-08-04 NOTE — ED Triage Notes (Signed)
Home covid test was positive this morning.  Scratchy throat, nasal congestion and sneezing since Friday.  Has been taking mucinex.

## 2023-08-04 NOTE — ED Provider Notes (Signed)
[facility]    CSN: [id number] Arrival date & time: 08/04/23  1349      History   Chief Complaint No chief complaint on file.   HPI John Clarke is a 77 y.o. male.   Presenting today with 4 to 5-day history of scratchy throat, congestion, sneezing, mild cough.  Denies fever, chills, chest pain, shortness of breath, abdominal pain, nausea vomiting or diarrhea.  So far tried Mucinex with minimal relief.  Home COVID test positive this morning.    Past Medical History:  Diagnosis Date   Arthritis    GERD (gastroesophageal reflux disease)    History of stomach ulcers    Hypertension     Patient Active Problem List   Diagnosis Date Noted   SHOULDER PAIN 10/24/2008    Past Surgical History:  Procedure Laterality Date   CATARACT EXTRACTION W/PHACO  10/27/2012   Procedure: CATARACT EXTRACTION PHACO AND INTRAOCULAR LENS PLACEMENT (IOC);  Surgeon: Gemma Payor, MD;  Location: [facility];  Service: Ophthalmology;  Laterality: Right;  CDE:10.68   right arm bicep  14 years ago       Home Medications    Prior to Admission medications   Medication Sig Start Date End Date Taking? Authorizing Provider  molnupiravir EUA (LAGEVRIO) 200 mg CAPS capsule Take 4 capsules (800 mg total) by mouth 2 (two) times daily for 5 days. 08/04/23 08/09/23 Yes Particia Nearing, PA-C  losartan (COZAAR) 50 MG tablet Take 0.5 tablets by mouth daily. 09/30/16   [provider]  Multiple Vitamin (MULTIVITAMIN WITH MINERALS) TABS Take 1 tablet by mouth daily.    [provider]  NIFEdipine (ADALAT CC) 90 MG 24 hr tablet Take 90 mg by mouth daily. 11/10/19   [provider]  primidone (MYSOLINE) 50 MG tablet Take 1 tablet by mouth daily. 11/13/19   [provider]    Family History History reviewed. No pertinent family history.  Social History Social History   Tobacco Use   Smoking status: Never   Smokeless tobacco: Never  Substance Use Topics    Alcohol use: No   Drug use: No     Allergies   Patient has no known allergies.   Review of Systems Review of Systems PER HPI  Physical Exam Triage Vital Signs ED Triage Vitals  Encounter Vitals Group     BP 08/04/23 1421 (!) 168/88     Systolic BP Percentile --      Diastolic BP Percentile --      Pulse Rate 08/04/23 1421 63     Resp 08/04/23 1421 16     Temp 08/04/23 1421 98.7 F (37.1 C)     Temp Source 08/04/23 1421 Oral     SpO2 08/04/23 1421 96 %     Weight --      Height --      Head Circumference --      Peak Flow --      Pain Score 08/04/23 1424 0     Pain Loc --      Pain Education --      Exclude from Growth Chart --    No data found.  Updated Vital Signs BP (!) 168/88 (BP Location: Right Arm)   Pulse 63   Temp 98.7 F (37.1 C) (Oral)   Resp 16   SpO2 96%   Visual Acuity Right Eye Distance:   Left Eye Distance:   Bilateral Distance:    Right Eye Near:   Left  Eye Near:    Bilateral Near:     Physical Exam Vitals and nursing note reviewed.  Constitutional:      Appearance: Normal appearance.  HENT:     Head: Atraumatic.     Right Ear: Tympanic membrane normal.     Left Ear: Tympanic membrane normal.     Nose: Rhinorrhea present.     Mouth/Throat:     Mouth: Mucous membranes are moist.     Pharynx: Oropharynx is clear. Posterior oropharyngeal erythema present.  Eyes:     Extraocular Movements: Extraocular movements intact.     Conjunctiva/sclera: Conjunctivae normal.  Cardiovascular:     Rate and Rhythm: Normal rate and regular rhythm.     Heart sounds: Normal heart sounds.  Pulmonary:     Effort: Pulmonary effort is normal.     Breath sounds: Normal breath sounds.  Musculoskeletal:        General: Normal range of motion.     Cervical back: Normal range of motion and neck supple.  Skin:    General: Skin is warm and dry.  Neurological:     General: No focal deficit present.     Mental Status: He is oriented to person, place, and  time.  Psychiatric:        Mood and Affect: Mood normal.        Thought Content: Thought content normal.        Judgment: Judgment normal.    UC Treatments / Results  Labs (all labs ordered are listed, but only abnormal results are displayed) Labs Reviewed - No data to display  EKG   Radiology No results found.  Procedures Procedures (including critical care time)  Medications Ordered in UC Medications - No data to display  Initial Impression / Assessment and Plan / UC Course  I have reviewed the triage vital signs and the nursing notes.  Pertinent labs & imaging results that were available during my care of the patient were reviewed by me and considered in my medical decision making (see chart for details).     Home COVID positive, symptoms and exam findings consistent with this.  Treat with molnupiravir, supportive over-the-counter medications and home care.  Return for worsening symptoms.  Final Clinical Impressions(s) / UC Diagnoses   Final diagnoses:  COVID-19   Discharge Instructions   None    ED Prescriptions     Medication Sig Dispense Auth. Provider   molnupiravir EUA (LAGEVRIO) 200 mg CAPS capsule Take 4 capsules (800 mg total) by mouth 2 (two) times daily for 5 days. 40 capsule Particia Nearing, [place]      PDMP not reviewed this encounter.   Particia Nearing, [place] 08/04/23 1726

## 2023-09-15 DIAGNOSIS — R7309 Other abnormal glucose: Secondary | ICD-10-CM | POA: Diagnosis not present

## 2023-09-15 DIAGNOSIS — I10 Essential (primary) hypertension: Secondary | ICD-10-CM | POA: Diagnosis not present

## 2023-09-15 DIAGNOSIS — Z0001 Encounter for general adult medical examination with abnormal findings: Secondary | ICD-10-CM | POA: Diagnosis not present

## 2023-09-15 DIAGNOSIS — E663 Overweight: Secondary | ICD-10-CM | POA: Diagnosis not present

## 2023-09-15 DIAGNOSIS — E782 Mixed hyperlipidemia: Secondary | ICD-10-CM | POA: Diagnosis not present

## 2023-09-15 DIAGNOSIS — Z6825 Body mass index (BMI) 25.0-25.9, adult: Secondary | ICD-10-CM | POA: Diagnosis not present

## 2023-09-15 DIAGNOSIS — K219 Gastro-esophageal reflux disease without esophagitis: Secondary | ICD-10-CM | POA: Diagnosis not present

## 2023-09-15 DIAGNOSIS — Z1331 Encounter for screening for depression: Secondary | ICD-10-CM | POA: Diagnosis not present

## 2023-09-15 DIAGNOSIS — M1991 Primary osteoarthritis, unspecified site: Secondary | ICD-10-CM | POA: Diagnosis not present

## 2023-09-15 DIAGNOSIS — Z23 Encounter for immunization: Secondary | ICD-10-CM | POA: Diagnosis not present

## 2023-09-15 DIAGNOSIS — Z125 Encounter for screening for malignant neoplasm of prostate: Secondary | ICD-10-CM | POA: Diagnosis not present

## 2023-10-06 DIAGNOSIS — Z1212 Encounter for screening for malignant neoplasm of rectum: Secondary | ICD-10-CM | POA: Diagnosis not present

## 2023-10-06 DIAGNOSIS — Z1211 Encounter for screening for malignant neoplasm of colon: Secondary | ICD-10-CM | POA: Diagnosis not present

## 2023-12-30 DIAGNOSIS — D649 Anemia, unspecified: Secondary | ICD-10-CM | POA: Diagnosis not present

## 2024-09-01 DIAGNOSIS — Z23 Encounter for immunization: Secondary | ICD-10-CM | POA: Diagnosis not present

## 2024-09-01 DIAGNOSIS — K219 Gastro-esophageal reflux disease without esophagitis: Secondary | ICD-10-CM | POA: Diagnosis not present

## 2024-09-01 DIAGNOSIS — I10 Essential (primary) hypertension: Secondary | ICD-10-CM | POA: Diagnosis not present

## 2024-09-01 DIAGNOSIS — Z8546 Personal history of malignant neoplasm of prostate: Secondary | ICD-10-CM | POA: Diagnosis not present

## 2024-09-01 DIAGNOSIS — R251 Tremor, unspecified: Secondary | ICD-10-CM | POA: Diagnosis not present

## 2024-09-01 DIAGNOSIS — Z7689 Persons encountering health services in other specified circumstances: Secondary | ICD-10-CM | POA: Diagnosis not present

## 2024-09-11 DIAGNOSIS — J069 Acute upper respiratory infection, unspecified: Secondary | ICD-10-CM | POA: Diagnosis not present
